# Patient Record
Sex: Male | Born: 1985
Health system: Southern US, Community
[De-identification: ages and names within clinical notes are randomized; demographics above are authoritative.]

## PROBLEM LIST (undated history)

## (undated) DIAGNOSIS — F419 Anxiety disorder, unspecified: Secondary | ICD-10-CM

## (undated) DIAGNOSIS — G44009 Cluster headache syndrome, unspecified, not intractable: Secondary | ICD-10-CM

## (undated) DIAGNOSIS — F329 Major depressive disorder, single episode, unspecified: Secondary | ICD-10-CM

## (undated) DIAGNOSIS — F8081 Childhood onset fluency disorder: Secondary | ICD-10-CM

## (undated) DIAGNOSIS — K219 Gastro-esophageal reflux disease without esophagitis: Secondary | ICD-10-CM

## (undated) DIAGNOSIS — F32A Depression, unspecified: Secondary | ICD-10-CM

## (undated) HISTORY — PX: NO PAST SURGERIES: SHX2092

## (undated) HISTORY — DX: Anxiety disorder, unspecified: F41.9

## (undated) HISTORY — DX: Depression, unspecified: F32.A

## (undated) HISTORY — DX: Childhood onset fluency disorder: F80.81

## (undated) HISTORY — DX: Cluster headache syndrome, unspecified, not intractable: G44.009

## (undated) HISTORY — DX: Major depressive disorder, single episode, unspecified: F32.9

---

## 2008-12-10 ENCOUNTER — Ambulatory Visit: Payer: Self-pay | Admitting: Family Medicine

## 2008-12-15 ENCOUNTER — Ambulatory Visit: Payer: Self-pay | Admitting: Family Medicine

## 2010-06-13 ENCOUNTER — Encounter: Payer: Self-pay | Admitting: Family Medicine

## 2010-06-13 ENCOUNTER — Ambulatory Visit (INDEPENDENT_AMBULATORY_CARE_PROVIDER_SITE_OTHER): Payer: Medicare Other | Admitting: Family Medicine

## 2010-06-13 ENCOUNTER — Other Ambulatory Visit: Payer: Self-pay | Admitting: Family Medicine

## 2010-06-13 ENCOUNTER — Ambulatory Visit: Payer: Medicare Other

## 2010-06-13 ENCOUNTER — Ambulatory Visit
Admission: RE | Admit: 2010-06-13 | Discharge: 2010-06-13 | Disposition: A | Discharge status: Home / Self Care | Payer: Medicare Other | Source: Ambulatory Visit | Attending: Family Medicine | Admitting: Family Medicine

## 2010-06-13 DIAGNOSIS — R52 Pain, unspecified: Secondary | ICD-10-CM

## 2010-06-13 DIAGNOSIS — R05 Cough: Secondary | ICD-10-CM

## 2010-06-13 DIAGNOSIS — M549 Dorsalgia, unspecified: Secondary | ICD-10-CM

## 2010-06-15 ENCOUNTER — Encounter: Payer: Self-pay | Admitting: Family Medicine

## 2010-06-21 NOTE — Assessment & Plan Note (Signed)
Summary: PAIN/DISCOMFORT IN LOWER BACK/SEVERE HEARTBURN/NH rm4   Vital Signs:  Patient Profile:   25 Years Old Male CC:      LBP Height:     68 inches Weight:      216.25 pounds O2 Sat:      98 % O2 treatment:    Room Air Temp:     98.9 degrees F oral Pulse rate:   107 / minute Resp:     18 per minute BP sitting:   138 / 89  (left arm) Cuff size:   regular  Vitals Entered By: Clemens Catholic LPN (June 13, 2010 5:43 PM)                  Updated Prior Medication List: No Medications Current Allergies (reviewed today): No known allergies History of Present Illness Chief Complaint: LBP History of Present Illness:  Subjective:  Patient complains of approximately one week history of of low back pain that only occurs while having a bowel movement.  No anal pain.  No blood in stool.  No recent changes in bowel movements and he denies constipation.  No abdominal pain.  No nausea/vomiting and appetite good.  In fact he notes that he has gained weight over the past year.  He recalls no back injury or change in physical activities recently.  He feels well otherwise.  REVIEW OF SYSTEMS Constitutional Symptoms      Denies fever, chills, night sweats, weight loss, weight gain, and fatigue.  Eyes       Denies change in vision, eye pain, eye discharge, glasses, contact lenses, and eye surgery. Ear/Nose/Throat/Mouth       Denies hearing loss/aids, change in hearing, ear pain, ear discharge, dizziness, frequent runny nose, frequent nose bleeds, sinus problems, sore throat, hoarseness, and tooth pain or bleeding.  Respiratory       Denies dry cough, productive cough, wheezing, shortness of breath, asthma, bronchitis, and emphysema/COPD.  Cardiovascular       Denies murmurs, chest pain, and tires easily with exhertion.    Gastrointestinal       Complains of diarrhea and constipation.      Denies stomach pain, nausea/vomiting, blood in bowel movements, and indigestion. Genitourniary      Denies painful urination, kidney stones, and loss of urinary control. Neurological       Denies paralysis, seizures, and fainting/blackouts. Musculoskeletal       Complains of muscle pain, joint pain, and muscle weakness.      Denies joint stiffness, decreased range of motion, redness, swelling, and gout.  Skin       Denies bruising, unusual mles/lumps or sores, and hair/skin or nail changes.  Psych       Denies mood changes, temper/anger issues, anxiety/stress, speech problems, depression, and sleep problems. Other Comments: pt c/o low back pain, worse with a BM x friday. no constipation. he has had heart burn x worse recently. he has taken OTC Pepto bismol with no relief.   Past History:  Past Medical History: Reviewed history from 12/10/2008 and no changes required. Unremarkable  Past Surgical History: Reviewed history from 12/10/2008 and no changes required. wisdom teeth removed 2009  Family History: mother and father alive and healthy Family History Hypertension  Social History: Reviewed history from 12/10/2008 and no changes required. denies smoking drinks one drink per week denies recreational drug use   Objective:  Appearance:  Patient appears healthy, stated age, and in no acute distress  Eyes:  Pupils  are equal, round, and reactive to light and accomdation.  Extraocular movement is intact.  Conjunctivae are not inflamed.  Mouth/pharynx:  Normal Neck:  Supple.  No adenopathy is present.  No thyromegaly is present  Lungs:  Clear to auscultation.  Breath sounds are equal.  Heart:  Regular rate and rhythm without murmurs, rubs, or gallops.  Abdomen:  Nontender without masses or hepatosplenomegaly.  Bowel sounds are present.  No CVA or flank tenderness.   Back:   Good range of motion.  No tenderness to palpation.  Straight leg raising test is negative.  Sitting knee extension test is negative.  Strength and sensation in the lower extremities is normal.   Patellar and achilles reflexes are normal.  Rectal Exam:  Anus is normal without surrounding erythema.   No external hemorrhoids are present.  No lesions are palpated in the rectal vault.  Stool is heme negative.   Prostate is nontender and not enlarged Skin:  No rash CBC:  WBC 6.4; Normal diff; Hgb 15.9 X-ray:  2-view abdomen: IMPRESSION:   1.  Moderate stool throughout the colon.  Constipation is not excluded. 2.  Question pars defects at L5. Assessment New Problems: BACK PAIN (ICD-724.5)  SUSPECT LOWER BACK PAIN A RESULT OF SPONDYLYLOSIS AT L5, EXACERBATED DURING BOWEL MOVEMENTS.  Plan New Orders: CBC w/Diff [[id number]] T-DG ABD 2 Views [74020] Est. Patient Level III [99213] Planning Comments:   Recommend back strengthening exercises ([facility] information and instruction patient handout given)  Avoid constipation; recommend stool softener and increased fiber in diet (may include Citrucel) Recommend gradual weight loss. If symptoms become worse or constant, recommend follow-up with sports med clinic.   The patient and/or caregiver has been counseled thoroughly with regard to medications prescribed including dosage, schedule, interactions, rationale for use, and possible side effects and they verbalize understanding.  Diagnoses and expected course of recovery discussed and will return if not improved as expected or if the condition worsens. Patient and/or caregiver verbalized understanding.   Orders Added: 1)  CBC w/Diff [[id number]] 2)  T-DG ABD 2 Views [74020] 3)  Est. Patient Level III [46962]

## 2011-01-10 ENCOUNTER — Encounter: Payer: Self-pay | Admitting: Family Medicine

## 2011-01-10 ENCOUNTER — Inpatient Hospital Stay (INDEPENDENT_AMBULATORY_CARE_PROVIDER_SITE_OTHER)
Admission: RE | Admit: 2011-01-10 | Discharge: 2011-01-10 | Disposition: A | Discharge status: Home / Self Care | Payer: Medicare Other | Source: Ambulatory Visit | Attending: Family Medicine | Admitting: Family Medicine

## 2011-01-10 ENCOUNTER — Other Ambulatory Visit: Payer: Medicare Other

## 2011-01-10 ENCOUNTER — Other Ambulatory Visit: Payer: Self-pay | Admitting: Family Medicine

## 2011-01-10 DIAGNOSIS — K219 Gastro-esophageal reflux disease without esophagitis: Secondary | ICD-10-CM

## 2011-01-10 DIAGNOSIS — R079 Chest pain, unspecified: Secondary | ICD-10-CM

## 2011-01-10 DIAGNOSIS — R1011 Right upper quadrant pain: Secondary | ICD-10-CM

## 2011-01-10 DIAGNOSIS — R109 Unspecified abdominal pain: Secondary | ICD-10-CM

## 2011-01-10 LAB — CONVERTED CEMR LAB
Bilirubin Urine: NEGATIVE
Nitrite: NEGATIVE
Urobilinogen, UA: 0.2

## 2011-01-11 ENCOUNTER — Ambulatory Visit
Admission: RE | Admit: 2011-01-11 | Discharge: 2011-01-11 | Disposition: A | Discharge status: Home / Self Care | Payer: Medicare Other | Source: Ambulatory Visit | Attending: Family Medicine | Admitting: Family Medicine

## 2011-01-11 ENCOUNTER — Telehealth (INDEPENDENT_AMBULATORY_CARE_PROVIDER_SITE_OTHER): Payer: Self-pay | Admitting: Emergency Medicine

## 2011-01-11 DIAGNOSIS — R1011 Right upper quadrant pain: Secondary | ICD-10-CM

## 2011-01-15 ENCOUNTER — Encounter: Payer: Self-pay | Admitting: Family Medicine

## 2011-01-15 ENCOUNTER — Ambulatory Visit
Admission: RE | Admit: 2011-01-15 | Discharge: 2011-01-15 | Disposition: A | Discharge status: Home / Self Care | Payer: Medicare Other | Source: Ambulatory Visit | Attending: Family Medicine | Admitting: Family Medicine

## 2011-01-15 ENCOUNTER — Other Ambulatory Visit: Payer: Self-pay | Admitting: Family Medicine

## 2011-01-15 ENCOUNTER — Inpatient Hospital Stay (INDEPENDENT_AMBULATORY_CARE_PROVIDER_SITE_OTHER)
Admission: RE | Admit: 2011-01-15 | Discharge: 2011-01-15 | Disposition: A | Discharge status: Home / Self Care | Payer: Medicare Other | Source: Ambulatory Visit | Attending: Family Medicine | Admitting: Family Medicine

## 2011-01-15 DIAGNOSIS — R071 Chest pain on breathing: Secondary | ICD-10-CM

## 2011-01-15 DIAGNOSIS — R0789 Other chest pain: Secondary | ICD-10-CM

## 2011-01-15 DIAGNOSIS — M546 Pain in thoracic spine: Secondary | ICD-10-CM

## 2011-01-15 DIAGNOSIS — K219 Gastro-esophageal reflux disease without esophagitis: Secondary | ICD-10-CM

## 2011-04-08 NOTE — Telephone Encounter (Signed)
  Phone Note Outgoing Call Call back at [facility] Phone [phone]   Call placed by: Emilio Math,  January 11, 2011 11:21 AM Call placed to: Patient Summary of Call: Called patient informed him of negative labs and ultrasound.  I did tell him about one elevated liver enyzme due to fatty liver.

## 2011-04-08 NOTE — Progress Notes (Signed)
Summary: pain in chest and back rm 4   Vital Signs:  Patient Profile:   25 Years Old Male CC:      Mid epigastric pain x last night Height:     68 inches Weight:      230 pounds O2 Sat:      96 % O2 treatment:    Room Air Temp:     98.4 degrees F oral Pulse rate:   88 / minute Resp:     18 per minute BP sitting:   131 / 85  (left arm) Cuff size:   regular  Vitals Entered By: Clemens Catholic LPN (January 10, 2011 9:00 AM)                  Updated Prior Medication List: No Medications Current Allergies (reviewed today): No known allergies History of Present Illness Chief Complaint: Mid epigastric pain x last night History of Present Illness:  Subjective:  Patient complains of 3 to 4 month history of persistent epigastric and right upper quadrant pain that consistently occurs at bedtime.  He has recurring acid reflux during the day.  He also notes that his indigestion recently has improved for about an hour after eating then recurs.  Last night he had discomfort radiating to his back and right scapular area.  Occasional mild nausea but no vomiting.  Symptoms are not exacerbated by any specific food.  No recent change in bowel movements.  No GU symptoms.  No chest pain with activity.  He has been taking some of his father's OTC omeprazole with some improvement. Family history of Acid reflux father.  Mother has had cholecystectomy.  No family history of ASCVD.  Patient does not smoke  REVIEW OF SYSTEMS Constitutional Symptoms      Denies fever, chills, night sweats, weight loss, weight gain, and fatigue.  Eyes       Denies change in vision, eye pain, eye discharge, glasses, contact lenses, and eye surgery. Ear/Nose/Throat/Mouth       Denies hearing loss/aids, change in hearing, ear pain, ear discharge, dizziness, frequent runny nose, frequent nose bleeds, sinus problems, sore throat, hoarseness, and tooth pain or bleeding.  Respiratory       Denies dry cough, productive  cough, wheezing, shortness of breath, asthma, bronchitis, and emphysema/COPD.  Cardiovascular       Denies murmurs, chest pain, and tires easily with exhertion.    Gastrointestinal       Complains of stomach pain.      Denies nausea/vomiting, diarrhea, constipation, blood in bowel movements, and indigestion.      Comments: nausea Genitourniary       Denies painful urination, kidney stones, and loss of urinary control. Neurological       Complains of headaches.      Denies paralysis, seizures, and fainting/blackouts. Musculoskeletal       Complains of muscle pain and joint pain.      Denies joint stiffness, decreased range of motion, redness, swelling, muscle weakness, and gout.  Skin       Denies bruising, unusual mles/lumps or sores, and hair/skin or nail changes.  Psych       Denies mood changes, temper/anger issues, anxiety/stress, speech problems, depression, and sleep problems. Other Comments: pt c/o sharp mid epigastric pain x last night after lying down.  he took Gas X with no relief..   Past History:  Past Medical History: Reviewed history from 12/10/2008 and no changes required. Unremarkable  Past Surgical History:  Reviewed history from 12/10/2008 and no changes required. wisdom teeth removed 2009  Family History: Reviewed history from 06/13/2010 and no changes required. mother and father alive and healthy Family History Hypertension  Social History: Reviewed history from 12/10/2008 and no changes required. denies smoking drinks one drink per week denies recreational drug use   Objective:  Appearance:  Patient is obese but otherwise appears healthy, stated age, and in no acute distress.  He has a severe stutter Eyes:  Pupils are equal, round, and reactive to light and accomodation.  Extraocular movement is intact.  Conjunctivae are not inflamed.  Mouth/pharynx:  moist mucous membranes  Neck:  Supple.  No adenopathy is present.  No thyromegaly is present  Lungs:   Clear to auscultation.  Breath sounds are equal.  Heart:  Regular rate and rhythm without murmurs, rubs, or gallops.  Abdomen:  Mild tenderness in the right upper quadrant and sub-ziphoid epigastric area without masses or hepatosplenomegaly.  There is also mild tenderness in the peri-umbilical region and right lower quadrant. Bowel sounds are present.  No CVA or flank tenderness.   Extremities:  No edema.   Skin:  No rash urinalysis (dipstick):  negative CBC:  WBC 6.3 ; LY 30.4, MO 7.2, GR 62.4; Hgb 16.0 EKG:  Normal  Assessment New Problems: ABDOMINAL PAIN (ICD-789.00) CHEST PAIN (ICD-786.50) ESOPHAGEAL REFLUX (ICD-530.81)  ? BILIARY COLIC  Plan New Medications/Changes: OMEPRAZOLE 40 MG CPDR (OMEPRAZOLE) One by mouth Qam 30 min AC  #15 x 1, 01/10/2011, Donna Christen MD  New Orders: CBC w/Diff [[id number]] Urinalysis [CPT-81003] T-Comprehensive Metabolic Panel [[id number]] T-Amylase [82150-23210] T-Lipase [83690-23215] EKG w/ Interpretation [93000] Ultrasound Abdomen [UAS] Est. Patient Level V [19147] Planning Comments:   CMP, Amylase, Lipase pending. Schedule GB ultrasound. Begin omeprazole 40mg , one qam ac. Recommend establishing relationship with PCP   The patient and/or caregiver has been counseled thoroughly with regard to medications prescribed including dosage, schedule, interactions, rationale for use, and possible side effects and they verbalize understanding.  Diagnoses and expected course of recovery discussed and will return if not improved as expected or if the condition worsens. Patient and/or caregiver verbalized understanding.  Prescriptions: OMEPRAZOLE 40 MG CPDR (OMEPRAZOLE) One by mouth Qam 30 min AC  #15 x 1   Entered and Authorized by:   Donna Christen MD   Signed by:   Donna Christen MD on 01/10/2011   Method used:   Print then Give to Patient   RxID:   [id number]   Orders Added: 1)  CBC w/Diff [[id number]] 2)  Urinalysis [CPT-81003] 3)   T-Comprehensive Metabolic Panel [[id number]] 4)  T-Amylase [82150-23210] 5)  T-Lipase [83690-23215] 6)  EKG w/ Interpretation [93000] 7)  Ultrasound Abdomen [UAS] 8)  Est. Patient Level V [24401]    Laboratory Results   Urine Tests  Date/Time Received: January 10, 2011 10:06 AM  Date/Time Reported: January 10, 2011 10:06 AM   Routine Urinalysis   Color: yellow Appearance: Clear Glucose: negative   (Normal Range: Negative) Bilirubin: negative   (Normal Range: Negative) Ketone: negative   (Normal Range: Negative) Spec. Gravity: 1.025   (Normal Range: 1.003-1.035) Blood: negative   (Normal Range: Negative) pH: 5.5   (Normal Range: 5.0-8.0) Protein: negative   (Normal Range: Negative) Urobilinogen: 0.2   (Normal Range: 0-1) Nitrite: negative   (Normal Range: Negative) Leukocyte Esterace: negative   (Normal Range: Negative)

## 2011-04-08 NOTE — Progress Notes (Signed)
Summary: PAIN IN CHEST/UPPER BACK rm 2   Vital Signs:  Patient Profile:   25 Years Old Male CC:      pain in upper back, chest x 2 days Height:     68 inches Weight:      230.75 pounds O2 Sat:      98 % O2 treatment:    Room Air Temp:     98.4 degrees F oral Pulse rate:   85 / minute Resp:     16 per minute BP sitting:   117 / 76  (left arm) Cuff size:   regular  Vitals Entered By: Clemens Catholic LPN (January 15, 2011 4:09 PM)                  Updated Prior Medication List: OMEPRAZOLE 40 MG CPDR (OMEPRAZOLE) One by mouth Qam 30 min AC  Current Allergies (reviewed today): No known allergies History of Present Illness Chief Complaint: pain in upper back, chest x 2 days History of Present Illness:  Subjective:  Patient reports that his GI symptoms have resolved after starting omeprazole.  Over the past 2 to 3 days he has developed a sensation of tightness in his anterior chest that somewhat restricts his respiration.  He has also developed discomfort over and beneath his shoulder blades, to the extent that he has difficulty sleeping when supine at night.  No shortness of breath.  No cough.  No fevers, chills, and sweats.  He wonders if the symptoms could be an adverse effect from omeprazole   REVIEW OF SYSTEMS Constitutional Symptoms      Denies fever, chills, night sweats, weight loss, weight gain, and fatigue.  Eyes       Denies change in vision, eye pain, eye discharge, glasses, contact lenses, and eye surgery. Ear/Nose/Throat/Mouth       Denies hearing loss/aids, change in hearing, ear pain, ear discharge, dizziness, frequent runny nose, frequent nose bleeds, sinus problems, sore throat, hoarseness, and tooth pain or bleeding.  Respiratory       Complains of shortness of breath.      Denies dry cough, productive cough, wheezing, asthma, bronchitis, and emphysema/COPD.  Cardiovascular       Complains of chest pain.      Denies murmurs and tires easily with exhertion.     Gastrointestinal       Denies stomach pain, nausea/vomiting, diarrhea, constipation, blood in bowel movements, and indigestion. Genitourniary       Denies painful urination, kidney stones, and loss of urinary control. Neurological       Denies paralysis, seizures, and fainting/blackouts. Musculoskeletal       Complains of muscle pain and joint pain.      Denies joint stiffness, decreased range of motion, redness, swelling, muscle weakness, and gout.  Skin       Denies bruising, unusual mles/lumps or sores, and hair/skin or nail changes.  Psych       Denies mood changes, temper/anger issues, anxiety/stress, speech problems, depression, and sleep problems. Other Comments: pt c/o upper back and chest pain, SOB, not sleeping x 2 days. he states that his reflux s/s are better.    Past History:  Past Medical History:  GERD  Past Surgical History: Reviewed history from 12/10/2008 and no changes required. wisdom teeth removed 2009  Family History: Reviewed history from 06/13/2010 and no changes required. mother and father alive and healthy Family History Hypertension  Social History: Reviewed history from 12/10/2008 and no changes required. denies  smoking drinks one drink per week denies recreational drug use   Objective:  No acute distress; patient appears comfortable and alert Eyes:  Pupils are equal, round, and reactive to light and accomodation.  Extraocular movement is intact.  Conjunctivae are not inflamed.  Pharynx:  Normal  Neck:  Supple.  No adenopathy is present.  Lungs:  Clear to auscultation.  Breath sounds are equal.  Chest:  No tenderness to palpation anteriorly.  Posterior chest reveals no tenderness over scapulae or back Heart:  Regular rate and rhythm without murmurs, rubs, or gallops.  Abdomen:  Nontender without masses or hepatosplenomegaly.  Bowel sounds are present.  No CVA or flank tenderness.  Skin:  No rash Chest x-ray:  Negative Assessment New  Problems: BACK PAIN, THORACIC REGION (ICD-724.1) CHEST [place] PAIN, ANTERIOR (ICD-786.52) GERD (ICD-530.81)  MUSCULOSKELETAL CHEST PAIN.  ? ADVERSE REACTION FROM OMEPRAZOLE  Plan New Orders: T-Chest x-ray, 2 views [71020] Est. Patient Level III [16109] Planning Comments:   Recommend holding omeprazole for 2 to 3 days.  If chest discomfort improves, try resuming omeprazole at 20mg  daily.  Tylenol for pain.   The patient and/or caregiver has been counseled thoroughly with regard to medications prescribed including dosage, schedule, interactions, rationale for use, and possible side effects and they verbalize understanding.  Diagnoses and expected course of recovery discussed and will return if not improved as expected or if the condition worsens. Patient and/or caregiver verbalized understanding.   Orders Added: 1)  T-Chest x-ray, 2 views [71020] 2)  Est. Patient Level III [60454]

## 2011-05-10 ENCOUNTER — Emergency Department (INDEPENDENT_AMBULATORY_CARE_PROVIDER_SITE_OTHER)
Admission: EM | Admit: 2011-05-10 | Discharge: 2011-05-10 | Disposition: A | Discharge status: Home / Self Care | Payer: Medicare Other | Source: Home / Self Care | Attending: Emergency Medicine | Admitting: Emergency Medicine

## 2011-05-10 ENCOUNTER — Encounter: Payer: Self-pay | Admitting: *Deleted

## 2011-05-10 DIAGNOSIS — R110 Nausea: Secondary | ICD-10-CM | POA: Diagnosis not present

## 2011-05-10 DIAGNOSIS — R109 Unspecified abdominal pain: Secondary | ICD-10-CM | POA: Diagnosis not present

## 2011-05-10 HISTORY — DX: Gastro-esophageal reflux disease without esophagitis: K21.9

## 2011-05-10 MED ORDER — PROMETHAZINE HCL 25 MG PO TABS: 25.0000 mg | ORAL_TABLET | Freq: Three times a day (TID) | ORAL | Status: DC | PRN

## 2011-05-10 NOTE — ED Notes (Signed)
Pt c/o off and on nausea, diarrhea, constipation and generalized abd pain x 2 1/2 wks. He has tried pepto bismol.

## 2011-05-10 NOTE — ED Provider Notes (Signed)
History     CSN: [id number]  Arrival date & time 05/10/11  1228   First MD Initiated Contact with Patient 05/10/11 1250      Chief Complaint  Patient presents with  . Nausea  . Diarrhea    (Consider location/radiation/quality/duration/timing/severity/associated sxs/prior treatment) HPI This patient presents today with nausea, and intermittent diarrhea and constipation for the last 10 days. He does not recall eating anything funny and no other sick contacts. No upper respiratory symptoms. He states that he has some spasms while having bowel movements and has about 5 soft stools per day. He has not been using any medications. His worse symptom for him it is his nausea. He has had an ultrasound and lab work done in the last few months that he reports is normal. He has never seen a specialist for this. He has been having nausea and intermittent symptoms for about the last 6 months. No fever, chills, or urinary symptoms.  Past Medical History  Diagnosis Date  . GERD (gastroesophageal reflux disease)     History reviewed. No pertinent past surgical history.  History reviewed. No pertinent family history.  History  Substance Use Topics  . Smoking status: Current Everyday Smoker -- 1.0 packs/day  . Smokeless tobacco: Not on file  . Alcohol Use: Yes      Review of Systems  Allergies  Review of patient's allergies indicates no known allergies.  Home Medications   Current Outpatient Rx  Name Route Sig Dispense Refill  . OMEPRAZOLE 20 MG PO CPDR Oral Take 20 mg by mouth daily.      Marland Kitchen PROMETHAZINE HCL 25 MG PO TABS Oral Take 1 tablet (25 mg total) by mouth every 8 (eight) hours as needed for nausea. Take 1/2 - 1 by mouth every 8 hours as needed for nausea 30 tablet 0    BP 107/69  Pulse 82  Temp(Src) 98.4 F (36.9 C) (Oral)  Resp 16  Ht 5\' 7"  (1.702 m)  Wt 233 lb 8 oz (105.915 kg)  BMI 36.57 kg/m2  SpO2 96%  Physical Exam  Nursing note and vitals  reviewed. Constitutional: He is oriented to person, place, and time. He appears well-developed and well-nourished.  HENT:  Head: Normocephalic and atraumatic.  Eyes: No scleral icterus.  Neck: Neck supple.  Cardiovascular: Regular rhythm and normal heart sounds.   Pulmonary/Chest: Effort normal and breath sounds normal. No respiratory distress.  Abdominal: Soft. Bowel sounds are normal. He exhibits no abdominal bruit and no mass. There is no hepatosplenomegaly. There is no tenderness. There is no rigidity, no rebound, no guarding, no CVA tenderness, no tenderness at McBurney's point and negative Murphy's sign.  Neurological: He is alert and oriented to person, place, and time.  Skin: Skin is warm and dry.  Psychiatric: He has a normal mood and affect. His speech is normal.    ED Course  Procedures (including critical care time)  Labs Reviewed - No data to display No results found.   1. Nausea   2. Abdominal pain, unspecified site       MDM   He has been having long-standing nausea and abdominal discomfort for the last few months. Abdominal ultrasound was also done and he reports it was negative. Blood work is also been done. Since this is a long-standing issue, really like him to see a gastroenterologist to see if there's anything more that can be done. May have some kind of gastroparesis versus irritable bowel syndrome that could be treated medically.  The patient agrees and thinks this would be a good idea as well. I've given a prescription for Phenergan to use as needed and have advised him on a bland diet for the next one to 2 weeks. It may also be appropriate for him to eventually talked to a nutritionist to decrease processed foods.  Lily Kocher, MD 05/10/11 1256

## 2011-05-14 DIAGNOSIS — E669 Obesity, unspecified: Secondary | ICD-10-CM | POA: Diagnosis not present

## 2011-05-14 DIAGNOSIS — R1110 Vomiting, unspecified: Secondary | ICD-10-CM | POA: Diagnosis not present

## 2011-05-14 DIAGNOSIS — R197 Diarrhea, unspecified: Secondary | ICD-10-CM | POA: Diagnosis not present

## 2011-05-14 DIAGNOSIS — R109 Unspecified abdominal pain: Secondary | ICD-10-CM | POA: Diagnosis not present

## 2011-05-14 DIAGNOSIS — R112 Nausea with vomiting, unspecified: Secondary | ICD-10-CM | POA: Diagnosis not present

## 2011-05-14 DIAGNOSIS — R1084 Generalized abdominal pain: Secondary | ICD-10-CM | POA: Diagnosis not present

## 2011-05-14 DIAGNOSIS — R110 Nausea: Secondary | ICD-10-CM | POA: Diagnosis not present

## 2011-05-15 DIAGNOSIS — R109 Unspecified abdominal pain: Secondary | ICD-10-CM | POA: Diagnosis not present

## 2011-05-15 DIAGNOSIS — R1084 Generalized abdominal pain: Secondary | ICD-10-CM | POA: Diagnosis not present

## 2011-05-15 DIAGNOSIS — R197 Diarrhea, unspecified: Secondary | ICD-10-CM | POA: Diagnosis not present

## 2011-05-15 DIAGNOSIS — R112 Nausea with vomiting, unspecified: Secondary | ICD-10-CM | POA: Diagnosis not present

## 2011-05-16 DIAGNOSIS — R1013 Epigastric pain: Secondary | ICD-10-CM | POA: Diagnosis not present

## 2011-05-16 DIAGNOSIS — R110 Nausea: Secondary | ICD-10-CM | POA: Diagnosis not present

## 2011-05-16 DIAGNOSIS — K219 Gastro-esophageal reflux disease without esophagitis: Secondary | ICD-10-CM | POA: Diagnosis not present

## 2011-05-17 DIAGNOSIS — R1013 Epigastric pain: Secondary | ICD-10-CM | POA: Diagnosis not present

## 2011-05-17 DIAGNOSIS — R110 Nausea: Secondary | ICD-10-CM | POA: Diagnosis not present

## 2011-05-17 DIAGNOSIS — K2980 Duodenitis without bleeding: Secondary | ICD-10-CM | POA: Diagnosis not present

## 2011-05-24 DIAGNOSIS — R112 Nausea with vomiting, unspecified: Secondary | ICD-10-CM | POA: Diagnosis not present

## 2011-05-24 DIAGNOSIS — Q458 Other specified congenital malformations of digestive system: Secondary | ICD-10-CM | POA: Diagnosis not present

## 2011-05-24 DIAGNOSIS — R933 Abnormal findings on diagnostic imaging of other parts of digestive tract: Secondary | ICD-10-CM | POA: Diagnosis not present

## 2011-05-24 DIAGNOSIS — R1013 Epigastric pain: Secondary | ICD-10-CM | POA: Diagnosis not present

## 2011-05-24 DIAGNOSIS — K2940 Chronic atrophic gastritis without bleeding: Secondary | ICD-10-CM | POA: Diagnosis not present

## 2011-07-16 DIAGNOSIS — J3489 Other specified disorders of nose and nasal sinuses: Secondary | ICD-10-CM | POA: Diagnosis not present

## 2011-07-16 DIAGNOSIS — K2900 Acute gastritis without bleeding: Secondary | ICD-10-CM | POA: Diagnosis not present

## 2011-07-16 DIAGNOSIS — R110 Nausea: Secondary | ICD-10-CM | POA: Diagnosis not present

## 2011-07-16 DIAGNOSIS — J338 Other polyp of sinus: Secondary | ICD-10-CM | POA: Diagnosis not present

## 2011-07-16 DIAGNOSIS — R51 Headache: Secondary | ICD-10-CM | POA: Diagnosis not present

## 2011-08-03 DIAGNOSIS — K2970 Gastritis, unspecified, without bleeding: Secondary | ICD-10-CM | POA: Diagnosis not present

## 2011-08-03 DIAGNOSIS — K2990 Gastroduodenitis, unspecified, without bleeding: Secondary | ICD-10-CM | POA: Diagnosis not present

## 2011-08-03 DIAGNOSIS — K7689 Other specified diseases of liver: Secondary | ICD-10-CM | POA: Diagnosis not present

## 2011-08-03 DIAGNOSIS — Z8711 Personal history of peptic ulcer disease: Secondary | ICD-10-CM | POA: Diagnosis not present

## 2011-08-03 DIAGNOSIS — Z8719 Personal history of other diseases of the digestive system: Secondary | ICD-10-CM | POA: Diagnosis not present

## 2011-08-03 DIAGNOSIS — R109 Unspecified abdominal pain: Secondary | ICD-10-CM | POA: Diagnosis not present

## 2011-08-09 DIAGNOSIS — R112 Nausea with vomiting, unspecified: Secondary | ICD-10-CM | POA: Diagnosis not present

## 2011-08-28 DIAGNOSIS — R109 Unspecified abdominal pain: Secondary | ICD-10-CM | POA: Diagnosis not present

## 2011-08-28 DIAGNOSIS — K5900 Constipation, unspecified: Secondary | ICD-10-CM | POA: Diagnosis not present

## 2011-08-28 DIAGNOSIS — R52 Pain, unspecified: Secondary | ICD-10-CM | POA: Diagnosis not present

## 2011-08-28 DIAGNOSIS — R1084 Generalized abdominal pain: Secondary | ICD-10-CM | POA: Diagnosis not present

## 2011-08-28 DIAGNOSIS — A048 Other specified bacterial intestinal infections: Secondary | ICD-10-CM | POA: Diagnosis not present

## 2011-09-11 DIAGNOSIS — A048 Other specified bacterial intestinal infections: Secondary | ICD-10-CM | POA: Diagnosis not present

## 2011-09-11 DIAGNOSIS — R110 Nausea: Secondary | ICD-10-CM | POA: Diagnosis not present

## 2011-11-11 DIAGNOSIS — R112 Nausea with vomiting, unspecified: Secondary | ICD-10-CM | POA: Diagnosis not present

## 2011-11-11 DIAGNOSIS — R634 Abnormal weight loss: Secondary | ICD-10-CM | POA: Diagnosis not present

## 2011-11-11 DIAGNOSIS — R109 Unspecified abdominal pain: Secondary | ICD-10-CM | POA: Diagnosis not present

## 2011-12-05 DIAGNOSIS — R634 Abnormal weight loss: Secondary | ICD-10-CM | POA: Diagnosis not present

## 2011-12-05 DIAGNOSIS — K7689 Other specified diseases of liver: Secondary | ICD-10-CM | POA: Diagnosis not present

## 2011-12-05 DIAGNOSIS — A048 Other specified bacterial intestinal infections: Secondary | ICD-10-CM | POA: Diagnosis not present

## 2011-12-05 DIAGNOSIS — R112 Nausea with vomiting, unspecified: Secondary | ICD-10-CM | POA: Diagnosis not present

## 2012-01-27 DIAGNOSIS — R634 Abnormal weight loss: Secondary | ICD-10-CM | POA: Diagnosis not present

## 2012-01-27 DIAGNOSIS — R112 Nausea with vomiting, unspecified: Secondary | ICD-10-CM | POA: Diagnosis not present

## 2012-02-26 DIAGNOSIS — R112 Nausea with vomiting, unspecified: Secondary | ICD-10-CM | POA: Diagnosis not present

## 2012-07-02 DIAGNOSIS — J069 Acute upper respiratory infection, unspecified: Secondary | ICD-10-CM | POA: Diagnosis not present

## 2012-11-10 ENCOUNTER — Telehealth: Payer: Self-pay | Admitting: *Deleted

## 2012-11-10 ENCOUNTER — Ambulatory Visit (INDEPENDENT_AMBULATORY_CARE_PROVIDER_SITE_OTHER): Payer: Medicare Other | Admitting: Family Medicine

## 2012-11-10 ENCOUNTER — Encounter: Payer: Self-pay | Admitting: Family Medicine

## 2012-11-10 VITALS — BP 124/72 | HR 75 | Ht 67.75 in | Wt 158.0 lb

## 2012-11-10 DIAGNOSIS — F329 Major depressive disorder, single episode, unspecified: Secondary | ICD-10-CM | POA: Insufficient documentation

## 2012-11-10 DIAGNOSIS — R259 Unspecified abnormal involuntary movements: Secondary | ICD-10-CM | POA: Diagnosis not present

## 2012-11-10 DIAGNOSIS — K219 Gastro-esophageal reflux disease without esophagitis: Secondary | ICD-10-CM

## 2012-11-10 DIAGNOSIS — F8081 Childhood onset fluency disorder: Secondary | ICD-10-CM | POA: Insufficient documentation

## 2012-11-10 DIAGNOSIS — F341 Dysthymic disorder: Secondary | ICD-10-CM

## 2012-11-10 DIAGNOSIS — F419 Anxiety disorder, unspecified: Secondary | ICD-10-CM | POA: Insufficient documentation

## 2012-11-10 DIAGNOSIS — R251 Tremor, unspecified: Secondary | ICD-10-CM

## 2012-11-10 DIAGNOSIS — F411 Generalized anxiety disorder: Secondary | ICD-10-CM

## 2012-11-10 DIAGNOSIS — F32A Depression, unspecified: Secondary | ICD-10-CM | POA: Insufficient documentation

## 2012-11-10 LAB — TSH: TSH: 0.929 u[IU]/mL (ref 0.350–4.500)

## 2012-11-10 MED ORDER — VENLAFAXINE HCL ER 75 MG PO CP24: ORAL_CAPSULE | ORAL | Status: DC

## 2012-11-10 MED ORDER — BUSPIRONE HCL 10 MG PO TABS: ORAL_TABLET | ORAL | Status: DC

## 2012-11-10 NOTE — Telephone Encounter (Signed)
Pt calls and states that the antidepressant you sent over is too expensive and wants to know if there is something cheaper an/or generic that you can send instead. Barry Dienes, LPN

## 2012-11-10 NOTE — Progress Notes (Signed)
CC: Austin Gill is a 27 y.o. male is here for Establish Care and Anxiety   Subjective: HPI:  Pleasant 27 year old on disability for stuttering here to establish care.  Patient complains of 2 months of worsening anxiety that came on somewhat abruptly 2 months ago has not gotten better or worse since onset. Describes as moderate severity. Slightly improved for a few hours after exercising on a daily basis. Nothing particularly makes symptoms worse but they're more noticeable at school and when trying to concentrate. Describes a constant sense of worrying about nothing in particular, physical symptoms of tremor occurred when anxiety is at its worst. Anxiety seems to interfere with sleep. He denies any recent or remote tobacco, alcohol, nor recreational drug use. He feels there is a component of depression that has joined anxiety over the past month moderate to mild in severity nothing particularly makes this better or worse.. he has been treated for this with Wellbutrin years ago which was mildly helpful, Paxil decades ago which caused suicidal thoughts. There has been no recent thoughts of wanting to harm himself or others.  Denies mental disturbance other than above. He lives with his parents, describes a great relationship, no other social stressors.  Describes a history of H. pylori treated with triple therapy a year ago he continues on a PPI without any abdominal pain reflux symptoms for gastrointestinal complaints  Review of Systems - General ROS: negative for - chills, fever, night sweats, weight gain or weight loss Ophthalmic ROS: negative for - decreased vision ENT ROS: negative for - hearing change, nasal congestion, tinnitus or allergies Hematological and Lymphatic ROS: negative for - bleeding problems, bruising or swollen lymph nodes Breast ROS: negative Respiratory ROS: no cough, shortness of breath, or wheezing Cardiovascular ROS: no chest pain or dyspnea on exertion Gastrointestinal  ROS: no abdominal pain, change in bowel habits, or black or bloody stools Genito-Urinary ROS: negative for - genital discharge, genital ulcers, incontinence or abnormal bleeding from genitals Musculoskeletal ROS: negative for - joint pain or muscle pain Neurological ROS: negative for - headaches or memory loss Dermatological ROS: negative for lumps, mole changes, rash and skin lesion changes  Past Medical History  Diagnosis Date  . GERD (gastroesophageal reflux disease)      History reviewed. No pertinent family history.   History  Substance Use Topics  . Smoking status: Never Smoker   . Smokeless tobacco: Not on file  . Alcohol Use: No     Objective: Filed Vitals:   11/10/12 1019  BP: 124/72  Pulse: 75    General: Alert and Oriented, No Acute Distress HEENT: Pupils equal, round, reactive to light. Conjunctivae clear.  Moist mucous membranes, pharynx unremarkable Lungs: Clear to auscultation bilaterally, no wheezing/ronchi/rales.  Comfortable work of breathing. Good air movement. Cardiac: Regular rate and rhythm. Normal S1/S2.  No murmurs, rubs, nor gallops.   Abdomen: Soft nontender Extremities: No peripheral edema.  Strong peripheral pulses.  Mental Status: No depression,nor agitation.  Mild anxiety, Frequent stuttering Skin: Warm and dry.  PHQ9=16 GAD-7= 20  Assessment & Plan: Kc was seen today for establish care and anxiety.  Diagnoses and associated orders for this visit:  Anxiety and depression - venlafaxine XR (EFFEXOR XR) 75 MG 24 hr capsule; Start with one capsule every morning for five days then increase to two capsules every morning. - Ambulatory referral to Psychiatry  GERD (gastroesophageal reflux disease)  Tremor - TSH  Stutter    Anxiety and depression: Uncontrolled, he did not  feel he had a great response with Wellbutrin years ago we will start Effexor avoiding SSRI given suicidal thoughts decades ago. Contracted for safety. He is  interested in professional counseling. Referral placed GERD: Control, continue PPI Tremor with anxiety we'll rule out hyperthyroidism  Return in about 4 weeks (around 12/08/2012) for depression and anxiety.

## 2012-11-10 NOTE — Telephone Encounter (Signed)
Substitution of buspirone sent to his [facility], I believe this is on the $4 list and should also significantly improve his anxiety.

## 2012-11-11 NOTE — Telephone Encounter (Signed)
Pt.notified

## 2012-12-07 ENCOUNTER — Encounter: Payer: Self-pay | Admitting: Family Medicine

## 2012-12-07 DIAGNOSIS — K760 Fatty (change of) liver, not elsewhere classified: Secondary | ICD-10-CM | POA: Insufficient documentation

## 2012-12-07 DIAGNOSIS — J338 Other polyp of sinus: Secondary | ICD-10-CM | POA: Insufficient documentation

## 2012-12-08 ENCOUNTER — Ambulatory Visit (INDEPENDENT_AMBULATORY_CARE_PROVIDER_SITE_OTHER): Payer: Medicare Other | Admitting: Family Medicine

## 2012-12-08 ENCOUNTER — Encounter: Payer: Self-pay | Admitting: Family Medicine

## 2012-12-08 VITALS — BP 118/70 | HR 78 | Wt 166.0 lb

## 2012-12-08 DIAGNOSIS — F341 Dysthymic disorder: Secondary | ICD-10-CM | POA: Diagnosis not present

## 2012-12-08 DIAGNOSIS — F419 Anxiety disorder, unspecified: Secondary | ICD-10-CM

## 2012-12-08 DIAGNOSIS — R110 Nausea: Secondary | ICD-10-CM

## 2012-12-08 MED ORDER — BUPROPION HCL ER (XL) 150 MG PO TB24: 150.0000 mg | ORAL_TABLET | ORAL | Status: DC

## 2012-12-08 NOTE — Progress Notes (Addendum)
CC: Austin Gill is a 27 y.o. male is here for Anxiety and Depression   Subjective: HPI:  Followup anxiety and depression: unable to afford Effexor he was taking BuSpar however developed nausea soon after starting it persisted daily and was intolerable it resolved completely the day after stopping BuSpar . He has been without medication for the last 2 weeks, he describes his depression as moderate in severity anxiety is now mild, symptoms have slightly improved since starting break from school but he is getting anxious about restarting in 2 weeks. He complains that depression is causing fatigue irregular sleep cycle and overall sense of gloom. Nothing else makes symptoms better or worse other than above symptoms are present on daily basis has not improved significantly since decline months ago . Denies that when harm himself or others, fevers, chills, unintentional weight loss or gain. Denies current nausea, abdominal pain, and GI disturbance   Review Of Systems Outlined In HPI  Past Medical History  Diagnosis Date  . GERD (gastroesophageal reflux disease)      History reviewed. No pertinent family history.   History  Substance Use Topics  . Smoking status: Never Smoker   . Smokeless tobacco: Not on file  . Alcohol Use: No     Objective: Filed Vitals:   12/08/12 1006  BP: 118/70  Pulse: 78    Vital signs reviewed. General: Alert and Oriented, No Acute Distress HEENT: Pupils equal, round, reactive to light. Conjunctivae clear.  External ears unremarkable.  Moist mucous membranes. Lungs: Clear and comfortable work of breathing, speaking in full sentences without accessory muscle use. Cardiac: Regular rate and rhythm.  Neuro: CN II-XII grossly intact, gait normal. Extremities: No peripheral edema.  Strong peripheral pulses.  Mental Status: No depression, anxiety, nor agitation. Logical though process. Skin: Warm and dry.  Assessment & Plan: Bassam was seen today for anxiety  and depression.  Diagnoses and associated orders for this visit:  Anxiety and depression - Discontinue: buPROPion (WELLBUTRIN XL) 150 MG 24 hr tablet; Take 1 tablet (150 mg total) by mouth every morning. - buPROPion (WELLBUTRIN XL) 150 MG 24 hr tablet; Take 1 tablet (150 mg total) by mouth every morning.  Nausea alone    Anxiety and depression: Chronic uncontrolled condition we discussed options including SNRI/SSRI, he would like to go back on Wellbutrin that he used 5 years ago, contracted for safety  Nausea: Resolved, feel this is due to BuSpar will have to intolerance list  Return in about 4 weeks (around 01/05/2013).

## 2013-01-05 ENCOUNTER — Ambulatory Visit: Payer: Medicare Other | Admitting: Family Medicine

## 2013-01-06 ENCOUNTER — Ambulatory Visit (INDEPENDENT_AMBULATORY_CARE_PROVIDER_SITE_OTHER): Payer: Medicare Other | Admitting: Family Medicine

## 2013-01-06 ENCOUNTER — Telehealth: Payer: Self-pay | Admitting: Family Medicine

## 2013-01-06 ENCOUNTER — Encounter: Payer: Self-pay | Admitting: Family Medicine

## 2013-01-06 VITALS — BP 111/61 | HR 72 | Wt 170.0 lb

## 2013-01-06 DIAGNOSIS — F341 Dysthymic disorder: Secondary | ICD-10-CM | POA: Diagnosis not present

## 2013-01-06 DIAGNOSIS — F329 Major depressive disorder, single episode, unspecified: Secondary | ICD-10-CM

## 2013-01-06 DIAGNOSIS — R05 Cough: Secondary | ICD-10-CM | POA: Diagnosis not present

## 2013-01-06 MED ORDER — BUPROPION HCL ER (XL) 150 MG PO TB24: 150.0000 mg | ORAL_TABLET | ORAL | Status: DC

## 2013-01-06 MED ORDER — CETIRIZINE HCL 10 MG PO TABS: 10.0000 mg | ORAL_TABLET | Freq: Every day | ORAL | Status: DC

## 2013-01-06 NOTE — Telephone Encounter (Signed)
Left message on vm

## 2013-01-06 NOTE — Progress Notes (Signed)
CC: Austin Gill is a 27 y.o. male is here for anxiety and depression   Subjective: HPI:  Followup anxiety: Patient has been taking Wellbutrin 150 mg daily. Since I saw him last he's noticed improvement with decreased anxiety while at school and while around crowds. He states he still has mild anxiety most days of the week but it does not interfere with his quality of life. Denies thoughts wanting to harm himself or others, depression, mental disturbance, paranoia, nor sleep disturbance  Patient complains of a nonproductive cough that has been present for 4 weeks. No interventions as of yet is presently daily basis can occur anytime of the day described as mild in severity nothing particularly makes it better or worse. Denies blood in sputum. Denies fevers, chills, chest pain, shortness of breath, wheezing, facial pressure, nasal congestion.   Review Of Systems Outlined In HPI  Past Medical History  Diagnosis Date  . GERD (gastroesophageal reflux disease)      History reviewed. No pertinent family history.   History  Substance Use Topics  . Smoking status: Never Smoker   . Smokeless tobacco: Not on file  . Alcohol Use: No     Objective: Filed Vitals:   01/06/13 0954  BP: 111/61  Pulse: 72    General: Alert and Oriented, No Acute Distress HEENT: Pupils equal, round, reactive to light. Conjunctivae clear.  External ears unremarkable, canals clear with intact TMs with appropriate landmarks.  Middle ear appears open without effusion. Pink inferior turbinates.  Moist mucous membranes, pharynx without inflammation nor lesions however moderate cobblestoning.  Neck supple without palpable lymphadenopathy nor abnormal masses. Lungs: Clear to auscultation bilaterally, no wheezing/ronchi/rales.  Comfortable work of breathing. Good air movement. Cardiac: Regular rate and rhythm. Normal S1/S2.  No murmurs, rubs, nor gallops.   Extremities: No peripheral edema.  Strong peripheral pulses.   Mental Status: No depression, anxiety, nor agitation. Skin: Warm and dry.  Assessment & Plan: Austin Gill was seen today for anxiety and depression.  Diagnoses and associated orders for this visit:  Anxiety and depression - Discontinue: buPROPion (WELLBUTRIN XL) 150 MG 24 hr tablet; Take 1 tablet (150 mg total) by mouth every morning. - buPROPion (WELLBUTRIN XL) 150 MG 24 hr tablet; Take 1 tablet (150 mg total) by mouth every morning.  Cough  Other Orders - cetirizine (ZYRTEC ALLERGY) 10 MG tablet; Take 1 tablet (10 mg total) by mouth daily.    Cough: Discussed with patient my suspicion of postnasal drip start  generic Zyrtec call if not improving after a few days and will call and nasal steroid if needed Anxiety and depression: Stable and improved continue Wellbutrin  25 minutes spent face-to-face during visit today of which at least 50% was counseling or coordinating care regarding anxiety, depression, cough.   Return in about 3 months (around 04/07/2013) for Anxiety FU.

## 2013-01-06 NOTE — Telephone Encounter (Signed)
Sue Lush, Will you please let Abhimanyu know that [facility] reports wellbutrin is on backorder until October, I've reprinted an Rx for this that I'd encourage him to use to see if he can find it in stock at places such as [facility], [facility], or any local pharmacies to last him until [facility] has this back in stock.  Rx in your inbox.

## 2013-03-11 ENCOUNTER — Other Ambulatory Visit: Payer: Self-pay

## 2013-04-07 ENCOUNTER — Ambulatory Visit: Payer: Medicare Other | Admitting: Family Medicine

## 2013-04-13 ENCOUNTER — Ambulatory Visit (INDEPENDENT_AMBULATORY_CARE_PROVIDER_SITE_OTHER): Payer: Medicare Other | Admitting: Family Medicine

## 2013-04-13 ENCOUNTER — Encounter: Payer: Self-pay | Admitting: Family Medicine

## 2013-04-13 VITALS — BP 119/72 | HR 67 | Wt 163.0 lb

## 2013-04-13 DIAGNOSIS — F341 Dysthymic disorder: Secondary | ICD-10-CM | POA: Diagnosis not present

## 2013-04-13 DIAGNOSIS — Z1159 Encounter for screening for other viral diseases: Secondary | ICD-10-CM | POA: Diagnosis not present

## 2013-04-13 DIAGNOSIS — Z113 Encounter for screening for infections with a predominantly sexual mode of transmission: Secondary | ICD-10-CM

## 2013-04-13 DIAGNOSIS — R05 Cough: Secondary | ICD-10-CM | POA: Diagnosis not present

## 2013-04-13 DIAGNOSIS — F329 Major depressive disorder, single episode, unspecified: Secondary | ICD-10-CM

## 2013-04-13 DIAGNOSIS — Z114 Encounter for screening for human immunodeficiency virus [HIV]: Secondary | ICD-10-CM

## 2013-04-13 MED ORDER — BUPROPION HCL ER (XL) 150 MG PO TB24: 150.0000 mg | ORAL_TABLET | ORAL | Status: DC

## 2013-04-13 NOTE — Progress Notes (Signed)
CC: Austin Gill is a 27 y.o. male is here for f/u depression and anxiety   Subjective: HPI:  Followup depression and anxiety: Over the past 3 months he been taking Wellbutrin daily without any known side effects. He is happy with almost complete resolution of anxiety and depression. Reports that he is much less reserved about talking to others now however this exacerbates his stuttering and will cause anxiety and depression. Overall he is happy with where he stands right now from a mental health standpoint. Denies thoughts of harming self or others, paranoia, nor hallucinations.  Patient is requesting HIV testing he reports he occasionally gets this. He has no symptoms that he is concerned about which could reflect contracted an STD. He is sexually active  Complains of a cough that he's had for the last 2 days he was in his regular state of health for the past month from a pulmonary standpoint. Cough is nonproductive without fever chills, shortness of breath, nor wheezing. Denies chest pain   Review Of Systems Outlined In HPI  Past Medical History  Diagnosis Date  . GERD (gastroesophageal reflux disease)      No family history on file.   History  Substance Use Topics  . Smoking status: Never Smoker   . Smokeless tobacco: Not on file  . Alcohol Use: No     Objective: Filed Vitals:   04/13/13 0921  BP: 119/72  Pulse: 67   Vital signs reviewed. General: Alert and Oriented, No Acute Distress HEENT: Pupils equal, round, reactive to light. Conjunctivae clear.  External ears unremarkable.  Moist mucous membranes. Middle ear clear and open with unremarkable pharynx Lungs: Clear and comfortable work of breathing, speaking in full sentences without accessory muscle use. No wheezing rhonchi or rales Cardiac: Regular rate and rhythm.  Neuro: CN II-XII grossly intact, gait normal. Extremities: No peripheral edema.  Strong peripheral pulses.  Mental Status: No depression, anxiety, nor  agitation. Logical though process. Skin: Warm and dry.  Assessment & Plan: Austin Gill was seen today for f/u depression and anxiety.  Diagnoses and associated orders for this visit:  Cough  Anxiety and depression - buPROPion (WELLBUTRIN XL) 150 MG 24 hr tablet; Take 1 tablet (150 mg total) by mouth every morning.  Screen for STD (sexually transmitted disease) - HIV antibody - RPR - Hepatitis B surface antigen - Hepatitis C antibody  Screening for HIV (human immunodeficiency virus) - HIV antibody    Cough: No signs of bacterial infection suspect viral bronchitis. Anxiety and depression: Controlled continue Wellbutrin STD screening: Discuss other options of STD screening beyond HIV he would like to also check syphilis and hepatitis labs above  25 minutes spent face-to-face during visit today of which at least 50% was counseling or coordinating care regarding cough, anxiety and depression, STD screening.   Return in about 5 months (around 09/11/2013).

## 2013-04-14 LAB — RPR

## 2013-04-14 LAB — HIV ANTIBODY (ROUTINE TESTING W REFLEX): HIV: NONREACTIVE

## 2013-05-13 IMAGING — CR DG CHEST 2V
2 series · 2 of 2 positions shown · non-contrast
Comparison: 06/13/2010

CLINICAL DATA: Chest tightness

CHEST - 2 VIEW

[view not recorded (1 of 2)]
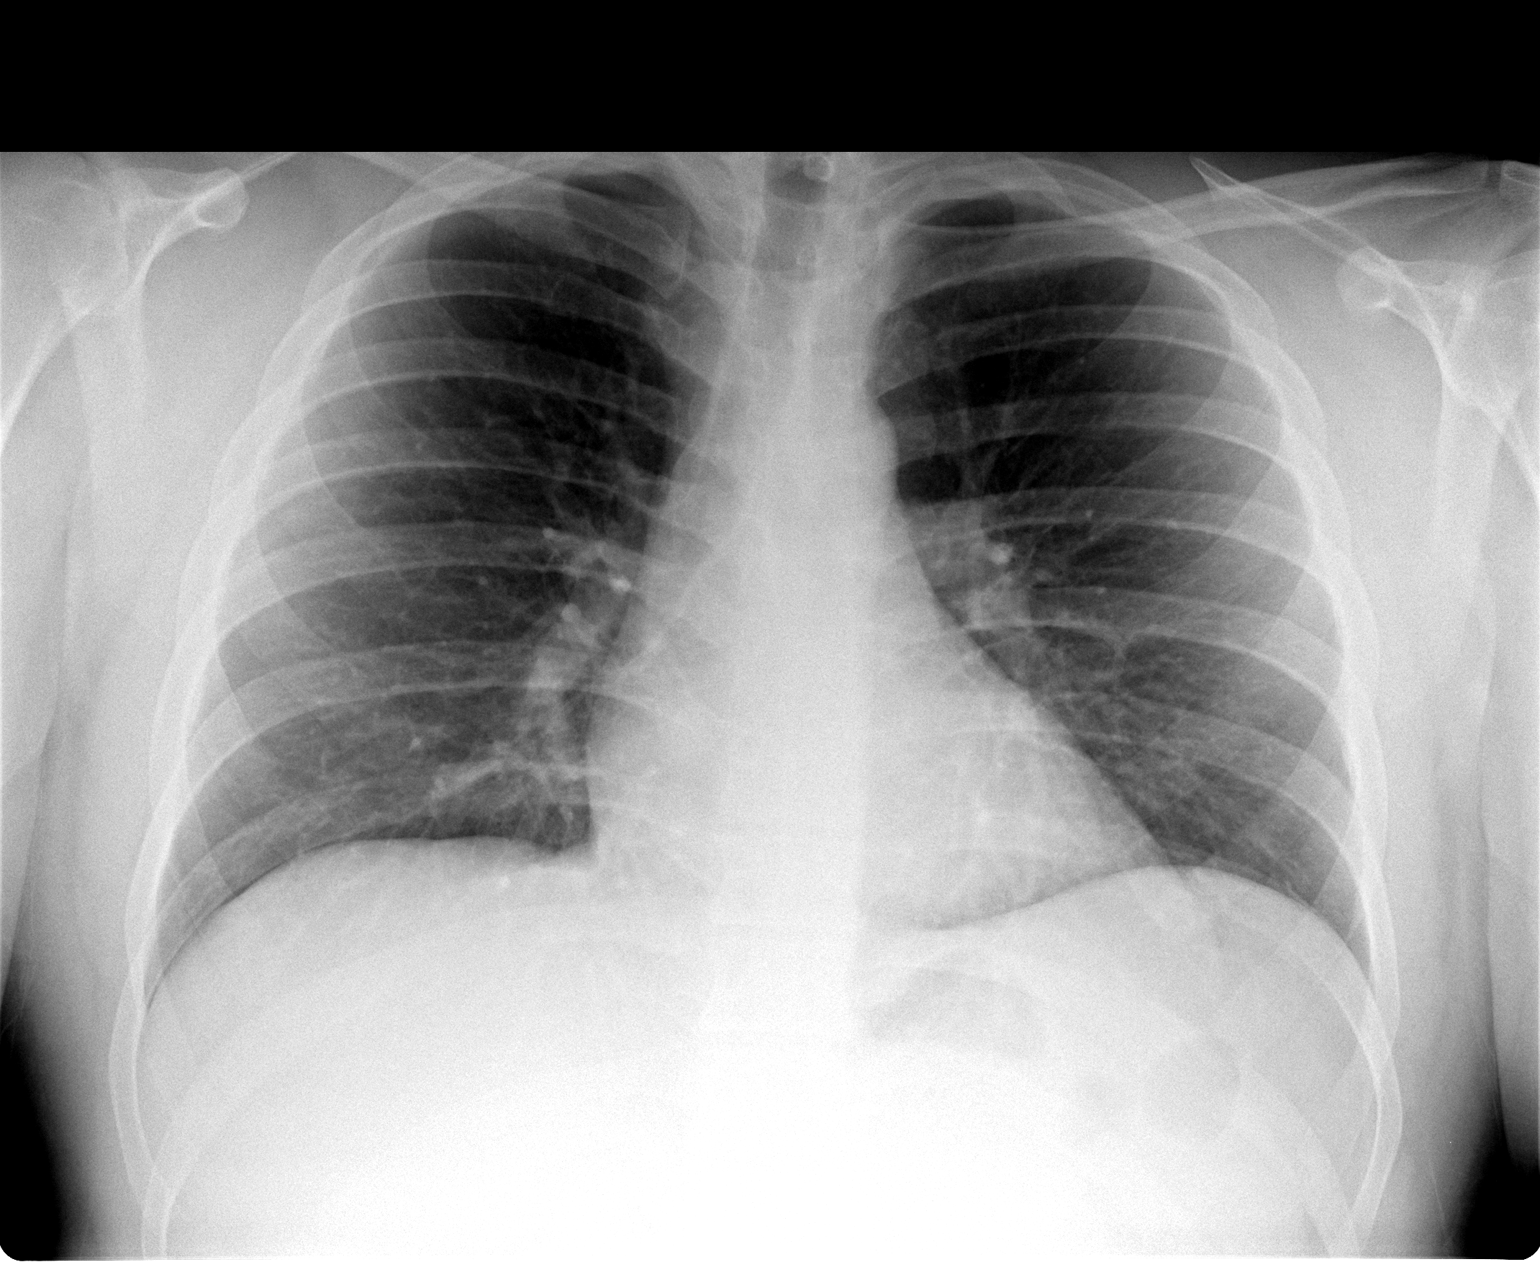

[view not recorded (2 of 2)]
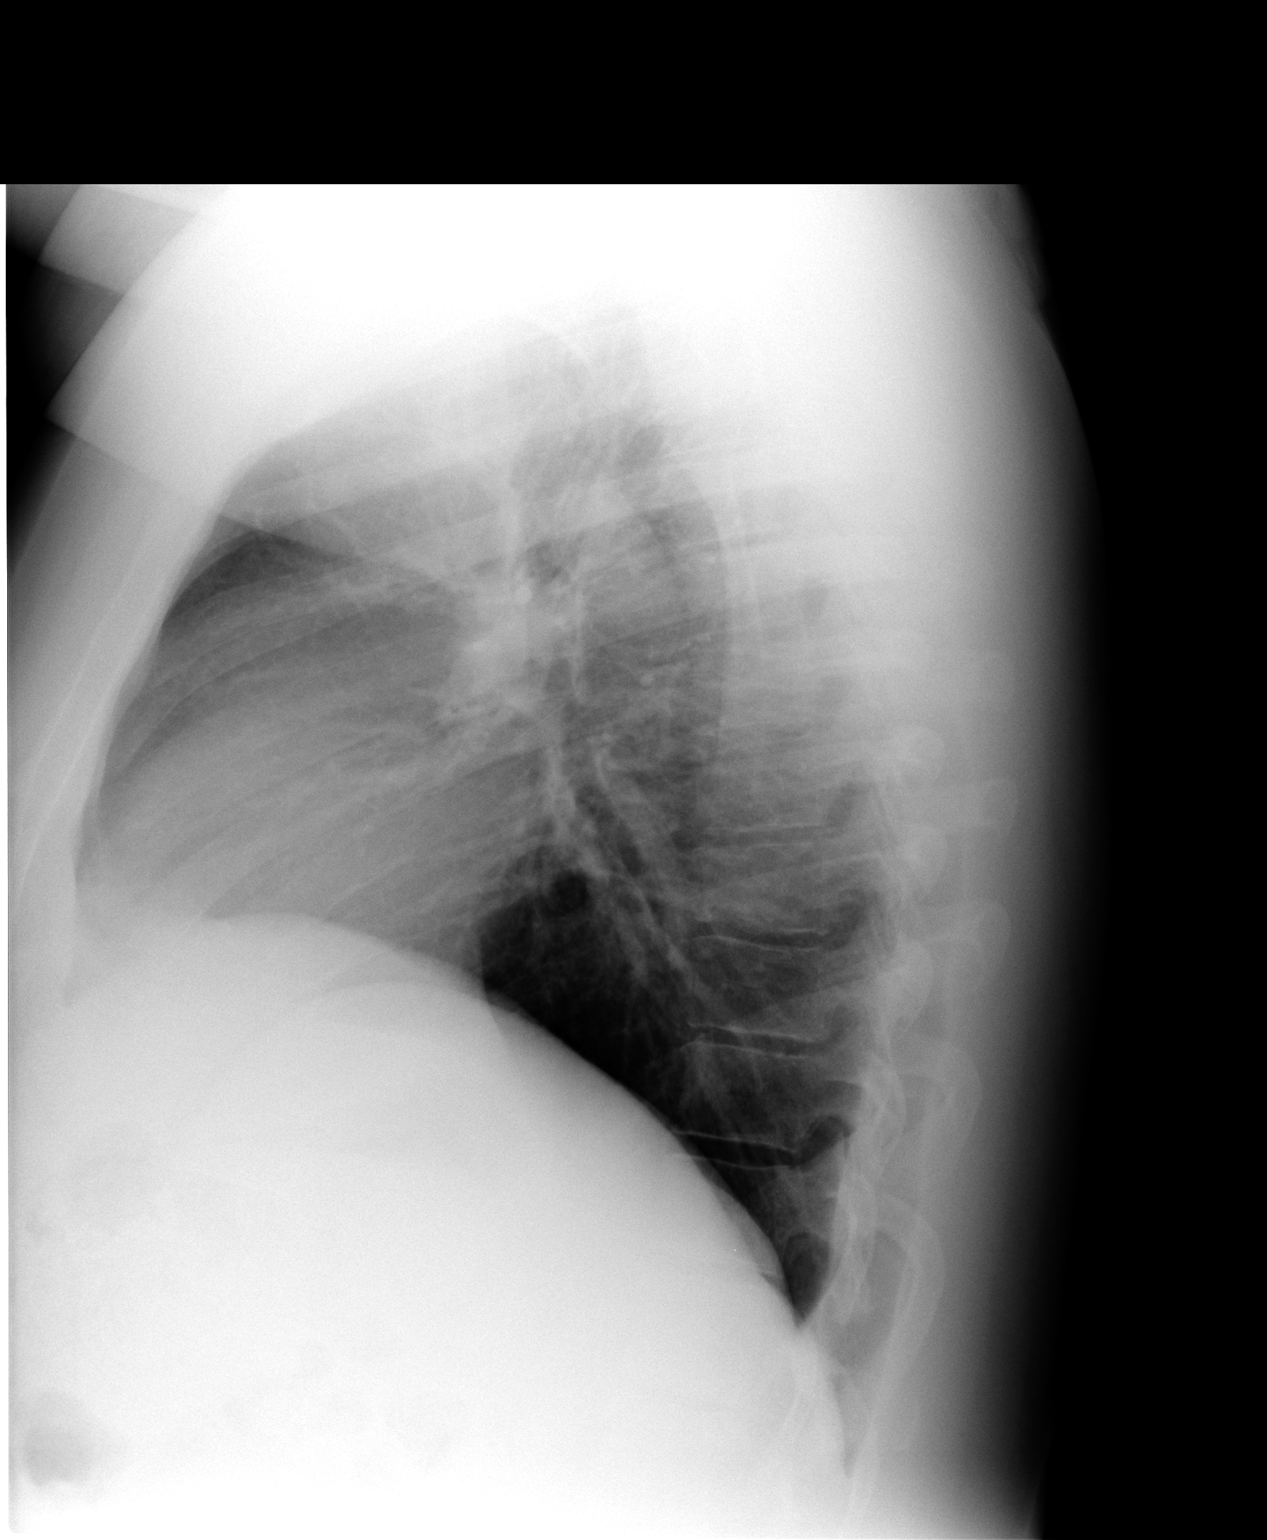

[2 of 2 positions shown; findings below may reference images not displayed]

FINDINGS: Lungs are clear. No pleural effusion or pneumothorax. The
cardiomediastinal contours are within normal limits. The visualized
bones and soft tissues are without significant appreciable
abnormality.
IMPRESSION: No acute cardiopulmonary process.

## 2013-07-13 ENCOUNTER — Encounter: Payer: Self-pay | Admitting: Family Medicine

## 2013-07-13 ENCOUNTER — Ambulatory Visit (INDEPENDENT_AMBULATORY_CARE_PROVIDER_SITE_OTHER): Payer: Medicare Other | Admitting: Family Medicine

## 2013-07-13 VITALS — BP 119/64 | HR 78 | Temp 97.9°F | Wt 157.0 lb

## 2013-07-13 DIAGNOSIS — J029 Acute pharyngitis, unspecified: Secondary | ICD-10-CM

## 2013-07-13 DIAGNOSIS — K529 Noninfective gastroenteritis and colitis, unspecified: Secondary | ICD-10-CM

## 2013-07-13 DIAGNOSIS — F341 Dysthymic disorder: Secondary | ICD-10-CM

## 2013-07-13 DIAGNOSIS — R197 Diarrhea, unspecified: Secondary | ICD-10-CM | POA: Diagnosis not present

## 2013-07-13 DIAGNOSIS — F329 Major depressive disorder, single episode, unspecified: Secondary | ICD-10-CM

## 2013-07-13 DIAGNOSIS — F419 Anxiety disorder, unspecified: Principal | ICD-10-CM

## 2013-07-13 MED ORDER — BUPROPION HCL 75 MG PO TABS: 75.0000 mg | ORAL_TABLET | Freq: Two times a day (BID) | ORAL | Status: DC

## 2013-07-13 NOTE — Progress Notes (Signed)
CC: Austin PurpuraDustin P Gill is a 28 y.o. male is here for Sore Throat   Subjective: HPI:  Acute visit for sore throat has been present off and on for the past 5 days he will come and go on a daily basis from moderate in severity to nonexistent. Slight improvement Chloraseptic spray no other interventions. Accompanied by mild nasal congestion but denies fevers, chills, choking, shortness of breath, chest pain, sinus pressure, nor ear complaints. Pain is absent today  He mentions that for the past month he's having 3 bowel movements a day which are well formed solid and painless. They occur after meals and are predictable. He denies any loose stools or blood nor melena appearance of his stool. Reports a sensation of hunger prior to his defecation. Denies abdominal pain.  Complains of a tremor and unintentional weight loss since starting Wellbutrin accompanied by decreased appetite. Still feels that Wellbutrin is helping anxiety and depression extremely well. Tremor is interfering with quality of life however. He's wondering if there is any adjustment that  Can be done to his Wellbutrin   Review Of Systems Outlined In HPI  Past Medical History  Diagnosis Date  . GERD (gastroesophageal reflux disease)     No past surgical history on file. No family history on file.  History   Social History  . Marital Status: Single    Spouse Name: N/A    Number of Children: N/A  . Years of Education: N/A   Occupational History  . Not on file.   Social History Main Topics  . Smoking status: Never Smoker   . Smokeless tobacco: Not on file  . Alcohol Use: No  . Drug Use: No  . Sexual Activity: No   Other Topics Concern  . Not on file   Social History Narrative  . No narrative on file     Objective: BP 119/64  Pulse 78  Temp(Src) 97.9 F (36.6 C) (Oral)  Wt 157 lb (71.215 kg)  General: Alert and Oriented, No Acute Distress HEENT: Pupils equal, round, reactive to light. Conjunctivae clear.   External ears unremarkable, canals clear with intact TMs with appropriate landmarks.  Middle ear appears open without effusion. Pink inferior turbinates.  Moist mucous membranes, pharynx without inflammation nor lesions.  Neck supple without palpable lymphadenopathy nor abnormal masses. Lungs: Clear to auscultation bilaterally, no wheezing/ronchi/rales.  Comfortable work of breathing. Good air movement. Mental Status: No depression, anxiety, nor agitation. Skin: Warm and dry.  Assessment & Plan: Austin HaileyDustin was seen today for sore throat.  Diagnoses and associated orders for this visit:  Anxiety and depression - buPROPion (WELLBUTRIN) 75 MG tablet; Take 1 tablet (75 mg total) by mouth 2 (two) times daily. Instead of 24h formulation.  Viral pharyngitis  Frequent stools    Viral pharyngitis: Discussed continuing supportive care consider Tylenol or ibuprofen if pain returns Frequent stools: Start with daily probiotics return for visit dedicated to this chief complaint if symptoms persist beyond one to 2 weeks or worsen Anxiety and depression: Controlled however side effects of Wellbutrin therefore switching to immediate release formulation, call if this is beneficial and we will provide with a long-term prescription   Return if symptoms worsen or fail to improve.

## 2013-09-13 ENCOUNTER — Ambulatory Visit: Payer: Medicare Other | Admitting: Family Medicine

## 2013-09-13 DIAGNOSIS — Z0289 Encounter for other administrative examinations: Secondary | ICD-10-CM

## 2013-09-23 ENCOUNTER — Ambulatory Visit: Payer: Medicare Other | Admitting: Family Medicine

## 2013-09-28 ENCOUNTER — Encounter: Payer: Self-pay | Admitting: Family Medicine

## 2013-09-28 ENCOUNTER — Ambulatory Visit (INDEPENDENT_AMBULATORY_CARE_PROVIDER_SITE_OTHER): Payer: Medicare Other | Admitting: Family Medicine

## 2013-09-28 VITALS — BP 118/72 | HR 67 | Wt 153.0 lb

## 2013-09-28 DIAGNOSIS — F341 Dysthymic disorder: Secondary | ICD-10-CM

## 2013-09-28 DIAGNOSIS — F32A Depression, unspecified: Secondary | ICD-10-CM

## 2013-09-28 DIAGNOSIS — G252 Other specified forms of tremor: Secondary | ICD-10-CM

## 2013-09-28 DIAGNOSIS — R059 Cough, unspecified: Secondary | ICD-10-CM

## 2013-09-28 DIAGNOSIS — G250 Essential tremor: Secondary | ICD-10-CM | POA: Diagnosis not present

## 2013-09-28 DIAGNOSIS — F329 Major depressive disorder, single episode, unspecified: Secondary | ICD-10-CM

## 2013-09-28 DIAGNOSIS — R05 Cough: Secondary | ICD-10-CM | POA: Diagnosis not present

## 2013-09-28 DIAGNOSIS — F419 Anxiety disorder, unspecified: Secondary | ICD-10-CM

## 2013-09-28 MED ORDER — KETOROLAC TROMETHAMINE 60 MG/2ML IM SOLN: 60.0000 mg | Freq: Once | INTRAMUSCULAR | Status: DC

## 2013-09-28 MED ORDER — BUPROPION HCL ER (XL) 150 MG PO TB24: 150.0000 mg | ORAL_TABLET | ORAL | Status: AC

## 2013-09-28 NOTE — Progress Notes (Signed)
CC: [name] P Gill is a 28 y.o. male is here for anxiety and depression f/u   Subjective: HPI:  Followup anxiety and depression: He states that anxiety and depression are absent to mild on Wellbutrin 150 long-acting or 75 mg immediate release twice a day. He is happy with his current symptom situation, would like to go back to the long-acting for ease of administration.  Denies any mental disturbance. Denies known side effects or intolerance.  Followup tremor: No improvement or change since switching formulation of Wellbutrin. He talked to his parents about this and his dad had identical symptoms, patient has been suffering from these symptoms since he was a child. He does have not been getting better or worsens onset per his report of her parents report. Localized in both hands, worse with purposeful movements, absent at rest. Does not get in the way of quality of life, no difficulty feeding. Denies rigidity, falls, family history of Parkinson's disease. He does not drink alcohol at all.  Complains of cough that has been present since Tuesday of last week. For the first 3 days it was accompanied by subjective fever, myalgias, nasal congestion. Old that has resolved other than nonproductive cough which is worse at night when lying down. Nothing particularly makes it better or worse other than above. Denies wheezing but does endorse mild shortness of breath when coughing. Denies chest pain. Denies fevers currently denies blood and sputum   Review Of Systems Outlined In HPI  Past Medical History  Diagnosis Date  . GERD (gastroesophageal reflux disease)     No past surgical history on file. No family history on file.  History   Social History  . Marital Status: Single    Spouse Name: N/A    Number of Children: N/A  . Years of Education: N/A   Occupational History  . Not on file.   Social History Main Topics  . Smoking status: Never Smoker   . Smokeless tobacco: Not on file  . Alcohol  Use: No  . Drug Use: No  . Sexual Activity: No   Other Topics Concern  . Not on file   Social History Narrative  . No narrative on file     Objective: BP 118/72  Pulse 67  Wt 153 lb (69.4 kg)  General: Alert and Oriented, No Acute Distress HEENT: Pupils equal, round, reactive to light. Conjunctivae clear.  External ears unremarkable, canals clear with intact TMs with appropriate landmarks.  Middle ear appears open without effusion. Pink inferior turbinates.  Moist mucous membranes, pharynx without inflammation nor lesions.  Neck supple without palpable lymphadenopathy nor abnormal masses. Lungs: Clear to auscultation bilaterally, no wheezing/ronchi/rales.  Comfortable work of breathing. Good air movement. Cardiac: Regular rate and rhythm. Normal S1/S2.  No murmurs, rubs, nor gallops.   Neuro: CN II-XII grossly intact, full strength/rom of all four extremities, gait normal, rapid alternating movements normal,no cogwheeling in upper extremities. Mild high frequency low amplitude tremor of both hands only when outstretched. No purposeful tremor. Extremities: No peripheral edema.  Strong peripheral pulses.  Mental Status: No depression, anxiety, nor agitation. Skin: Warm and dry.  Assessment & Plan: [name] was seen today for anxiety and depression f/u.  Diagnoses and associated orders for this visit:  Cough  Essential tremor  Anxiety and depression - buPROPion (WELLBUTRIN XL) 150 MG 24 hr tablet; Take 1 tablet (150 mg total) by mouth every morning.  Other Orders - Discontinue: ketorolac (TORADOL) injection 60 mg; Inject 2 mLs (60 mg  total) into the muscle once.    Cough: Discussed using guaifenesin, expect that this will linger for another week, discussed post viral cough syndrome. Anxiety and depression: Controlled continue Wellbutrin switching back to long-acting formulation Essential tremor: Discussed benign nature of this condition, option of using propranolol however he  understandably declined.  Please disregard documentation of ketorolac above, this was placed in the wrong chart.    Return in about 6 months (around 03/31/2014).

## 2013-10-15 ENCOUNTER — Telehealth: Payer: Self-pay | Admitting: *Deleted

## 2013-10-15 DIAGNOSIS — F32A Depression, unspecified: Secondary | ICD-10-CM

## 2013-10-15 DIAGNOSIS — F419 Anxiety disorder, unspecified: Principal | ICD-10-CM

## 2013-10-15 DIAGNOSIS — F329 Major depressive disorder, single episode, unspecified: Secondary | ICD-10-CM

## 2013-10-15 NOTE — Telephone Encounter (Signed)
Pt's father called and wanted to know if pt could be referred to someone in re to his mental health. His father states he is having a lot of "trouble coping with things"

## 2013-10-15 NOTE — Telephone Encounter (Signed)
Of course, Referral has bee nplaced, please contact me if not called about scheduling within the next week.

## 2013-10-15 NOTE — Telephone Encounter (Signed)
Pt.notified

## 2013-10-29 ENCOUNTER — Telehealth: Payer: Self-pay | Admitting: *Deleted

## 2013-10-29 NOTE — Telephone Encounter (Signed)
Pt's father called and states he had not heard anything back about an appointment. For some reason when its a behavioral health referral I cannot see the referral in order to give pt any type of updates on status of ref.Can you please see if  they are working on this downstairs and let Austin Gill know?

## 2013-10-29 NOTE — Telephone Encounter (Signed)
noted 

## 2013-10-29 NOTE — Telephone Encounter (Signed)
Sue LushAndrea, I [facility] and she states that she has this patients referral to work on and she will call the patient. Thanks

## 2013-11-02 ENCOUNTER — Emergency Department (INDEPENDENT_AMBULATORY_CARE_PROVIDER_SITE_OTHER)
Admission: EM | Admit: 2013-11-02 | Discharge: 2013-11-02 | Disposition: A | Discharge status: Home / Self Care | Payer: Medicare Other | Source: Home / Self Care | Attending: Emergency Medicine | Admitting: Emergency Medicine

## 2013-11-02 ENCOUNTER — Encounter: Payer: Self-pay | Admitting: Emergency Medicine

## 2013-11-02 DIAGNOSIS — B029 Zoster without complications: Secondary | ICD-10-CM | POA: Diagnosis not present

## 2013-11-02 MED ORDER — VALACYCLOVIR HCL 1 G PO TABS: ORAL_TABLET | ORAL | Status: DC

## 2013-11-02 MED ORDER — PREDNISONE (PAK) 10 MG PO TABS: ORAL_TABLET | ORAL | Status: DC

## 2013-11-02 NOTE — ED Notes (Signed)
Thursday 6 days ago noticed stabbing pain on left side of neck, red painful rash developed.

## 2013-11-02 NOTE — Discharge Instructions (Signed)
Shingles °Shingles (herpes zoster) is an infection that is caused by the same virus that causes chickenpox (varicella). The infection causes a painful skin rash and fluid-filled blisters, which eventually break open, crust over, and heal. It may occur in any area of the body, but it usually affects only one side of the body or face. The pain of shingles usually lasts about 1 month. However, some people with shingles may develop long-term (chronic) pain in the affected area of the body. °Shingles often occurs many years after the person had chickenpox. It is more common: °· In people older than 50 years. °· In people with weakened immune systems, such as those with HIV, AIDS, or cancer. °· In people taking medicines that weaken the immune system, such as transplant medicines. °· In people under great stress. °CAUSES  °Shingles is caused by the varicella zoster virus (VZV), which also causes chickenpox. After a person is infected with the virus, it can remain in the person's body for years in an inactive state (dormant). To cause shingles, the virus reactivates and breaks out as an infection in a nerve root. °The virus can be spread from person to person (contagious) through contact with open blisters of the shingles rash. It will only spread to people who have not had chickenpox. When these people are exposed to the virus, they may develop chickenpox. They will not develop shingles. Once the blisters scab over, the person is no longer contagious and cannot spread the virus to others. °SYMPTOMS  °Shingles shows up in stages. The initial symptoms may be pain, itching, and tingling in an area of the skin. This pain is usually described as burning, stabbing, or throbbing. In a few days or weeks, a painful red rash will appear in the area where the pain, itching, and tingling were felt. The rash is usually on one side of the body in a band or belt-like pattern. Then, the rash usually turns into fluid-filled blisters. They  will scab over and dry up in approximately 2-3 weeks. °Flu-like symptoms may also occur with the initial symptoms, the rash, or the blisters. These may include: °· Fever. °· Chills. °· Headache. °· Upset stomach. °DIAGNOSIS  °Your caregiver will perform a skin exam to diagnose shingles. Skin scrapings or fluid samples may also be taken from the blisters. This sample will be examined under a microscope or sent to a lab for further testing. °TREATMENT  °There is no specific cure for shingles. Your caregiver will likely prescribe medicines to help you manage the pain, recover faster, and avoid long-term problems. This may include antiviral drugs, anti-inflammatory drugs, and pain medicines. °HOME CARE INSTRUCTIONS  °· Take a cool bath or apply cool compresses to the area of the rash or blisters as directed. This may help with the pain and itching.   °· Only take over-the-counter or prescription medicines as directed by your caregiver.   °· Rest as directed by your caregiver. °· Keep your rash and blisters clean with mild soap and cool water or as directed by your caregiver.  °· Do not pick your blisters or scratch your rash. Apply an anti-itch cream or numbing creams to the affected area as directed by your caregiver. °· Keep your shingles rash covered with a loose bandage (dressing). °· Avoid skin contact with: °¨ Babies.   °¨ Pregnant women.   °¨ Children with eczema.   °¨ Elderly people with transplants.   °¨ People with chronic illnesses, such as leukemia or AIDS.   °· Wear loose-fitting clothing to help ease the   pain of material rubbing against the rash. °· Keep all follow-up appointments with your caregiver. If the area involved is on your face, you may receive a referral for follow-up to a specialist, such as an eye doctor (ophthalmologist) or an ear, nose, and throat (ENT) doctor. Keeping all follow-up appointments will help you avoid eye complications, chronic pain, or disability.   °SEEK IMMEDIATE MEDICAL  CARE IF:  °· You have facial pain, pain around the eye area, or loss of feeling on one side of your face. °· You have ear pain or ringing in your ear. °· You have loss of taste. °· Your pain is not relieved with prescribed medicines.   °· Your redness or swelling spreads.   °· You have more pain and swelling.  °· Your condition is worsening or has changed.   °· You have a fever or persistent symptoms for more than 2-3 days. °· You have a fever and your symptoms suddenly get worse. °MAKE SURE YOU: °· Understand these instructions. °· Will watch your condition. °· Will get help right away if you are not doing well or get worse. °Document Released: 04/22/2005 Document Revised: 01/15/2012 Document Reviewed: 12/05/2011 °[employer]® Patient Information ©2015 [employer]. This information is not intended to replace advice given to you by your health care provider. Make sure you discuss any questions you have with your health care provider. ° °

## 2013-11-02 NOTE — ED Provider Notes (Signed)
CSN: [id number]     Arrival date & time 11/02/13  1010 History   First MD Initiated Contact with Patient 11/02/13 1017     Chief Complaint  Patient presents with  . Rash   (Consider location/radiation/quality/duration/timing/severity/associated sxs/prior Treatment) HPI About 4 days ago, began with a stabbing severe pain left side of neck. Then, 2 days ago painful slightly blistery rash developed in the same location. Tried OTC Tylenol or ibuprofen without significant relief . He states immunizations up-to-date, but has never had shingles. Denies systemic symptoms. No fever or chills or URI symptoms. Past Medical History  Diagnosis Date  . GERD (gastroesophageal reflux disease)    History reviewed. No pertinent past surgical history. No family history on file. History  Substance Use Topics  . Smoking status: Never Smoker   . Smokeless tobacco: Not on file  . Alcohol Use: No    Review of Systems  All other systems reviewed and are negative.   Allergies  Buspar and Paxil  Home Medications   Prior to Admission medications   Medication Sig Start Date End Date Taking? Authorizing Provider  buPROPion (WELLBUTRIN XL) 150 MG 24 hr tablet Take 1 tablet (150 mg total) by mouth every morning. 09/28/13 09/28/14  Laren BoomSean Hommel, DO  cetirizine (ZYRTEC ALLERGY) 10 MG tablet Take 1 tablet (10 mg total) by mouth daily. 01/06/13   Sean Hommel, DO  omeprazole (PRILOSEC) 20 MG capsule Take 20 mg by mouth daily.      Historical Provider, MD  predniSONE (STERAPRED UNI-PAK) 10 MG tablet Take as directed for 6 days.--Take 6 on day 1, 5 on day 2, 4 on day 3, then 3 tablets on day 4, then 2 tablets on day 5, then 1 on day 6. 11/02/13   Lajean Manesavid Massey, MD  valACYclovir (VALTREX) 1000 MG tablet Take one by mouth 3 times a day for 7 days 11/02/13   Lajean Manesavid Massey, MD   BP 136/75  Pulse 68  Temp(Src) 98.2 F (36.8 C) (Oral)  Ht 5\' 7"  (1.702 m)  Wt 154 lb (69.854 kg)  BMI 24.11 kg/m2  SpO2 99% Physical Exam   Nursing note and vitals reviewed. Constitutional: He is oriented to person, place, and time. He appears well-developed and well-nourished. No distress.  Uncomfortable from left lateral neck pain. Alert, cooperative. Prominence stutter.  HENT:  Head: Normocephalic and atraumatic.  Eyes: Conjunctivae and EOM are normal. Pupils are equal, round, and reactive to light. No scleral icterus.  Neck: Normal range of motion.  Cardiovascular: Normal rate.   Pulmonary/Chest: Effort normal.  Abdominal: He exhibits no distension.  Musculoskeletal: Normal range of motion.  Neurological: He is alert and oriented to person, place, and time. No cranial nerve deficit.  Skin: Skin is warm.  Red, vesicular eruption in dermatomal distribution left posterior-lateral neck.----Does not cross the midline No fluctuance or drainage . No bony tenderness  Psychiatric: He has a normal mood and affect.    ED Course  Procedures (including critical care time) Labs Review Labs Reviewed - No data to display  Imaging Review No results found.   MDM   1. Shingles outbreak    Treatment options discussed, as well as risks, benefits, alternatives. Patient voiced understanding and agreement with the following plans: Valtrex 1 g 3 times a day x7 days Prednisone 10 mg-6 day Dosepak Other symptomatic care discussed Follow-up with your primary care doctor in 5-7 days if not improving, or sooner if symptoms become worse. Precautions discussed. Red flags discussed. Questions invited  and answered. Patient voiced understanding and agreement.     Lajean Manesavid Massey, MD 11/02/13 [phone]

## 2013-11-04 ENCOUNTER — Telehealth: Payer: Self-pay | Admitting: *Deleted

## 2013-11-04 MED ORDER — HYDROCODONE-ACETAMINOPHEN 5-325 MG PO TABS: 1.0000 | ORAL_TABLET | ORAL | Status: DC | PRN

## 2013-11-04 MED ORDER — GABAPENTIN 300 MG PO CAPS: ORAL_CAPSULE | ORAL | Status: DC

## 2013-11-04 NOTE — Telephone Encounter (Signed)
Pt was dx with shingles in UC yesterday. He was rx'ed valtrex but nothing for pain. Pt states the pain is unbearable today. He would like something for pain.Please advise

## 2013-11-04 NOTE — Telephone Encounter (Signed)
Pt notified and voiced undertanding

## 2013-11-04 NOTE — Telephone Encounter (Signed)
Gabapentin prescribed in a steady up taper, have him be sure to follow the directions carefully on how to increase the dose of the medication.

## 2013-11-05 ENCOUNTER — Telehealth: Payer: Self-pay | Admitting: Emergency Medicine

## 2013-11-05 MED ORDER — ACYCLOVIR 5 % EX OINT: 1.0000 "application " | TOPICAL_OINTMENT | CUTANEOUS | Status: DC

## 2013-11-06 ENCOUNTER — Encounter: Payer: Self-pay | Admitting: Emergency Medicine

## 2013-11-06 ENCOUNTER — Emergency Department
Admission: EM | Admit: 2013-11-06 | Discharge: 2013-11-06 | Disposition: A | Discharge status: Home / Self Care | Payer: Medicare Other | Source: Home / Self Care

## 2013-11-06 NOTE — ED Notes (Signed)
Patient has called about surrounding shingles issues over past 2 days; requesting pain medication>rx for Vicodin; concerned about facial itching>Valtrex ointment (not started yet). Wondering if facial itching an allergic reaction to the Vicodin.

## 2013-11-09 ENCOUNTER — Ambulatory Visit (HOSPITAL_COMMUNITY): Payer: Medicare Other | Admitting: Psychiatry

## 2013-11-09 ENCOUNTER — Telehealth: Payer: Self-pay | Admitting: *Deleted

## 2013-11-12 ENCOUNTER — Ambulatory Visit (INDEPENDENT_AMBULATORY_CARE_PROVIDER_SITE_OTHER): Payer: Medicare Other | Admitting: Family Medicine

## 2013-11-12 ENCOUNTER — Encounter: Payer: Self-pay | Admitting: Family Medicine

## 2013-11-12 VITALS — BP 116/72 | HR 70 | Wt 161.0 lb

## 2013-11-12 DIAGNOSIS — B029 Zoster without complications: Secondary | ICD-10-CM

## 2013-11-12 DIAGNOSIS — Z202 Contact with and (suspected) exposure to infections with a predominantly sexual mode of transmission: Secondary | ICD-10-CM

## 2013-11-12 DIAGNOSIS — Z113 Encounter for screening for infections with a predominantly sexual mode of transmission: Secondary | ICD-10-CM

## 2013-11-12 NOTE — Progress Notes (Signed)
CC: Austin Gill is a 28 y.o. male is here for f/u UC visit   Subjective: HPI:  Followup shingles: 2 weeks ago he developed a painful raised rash on his left neck and left shoulder. Since it began he has been on oral Valtrex regimens twice, a prednisone taper, and was taken occasional hydrocodone. He says the rash is becoming a state since this week began in the pain is significantly better but still moderate in severity described as stinging and sharpness. Denies new rashes elsewhere. Reports generalized myalgias since this all came on but no other accompanying symptoms.  Denies fevers, chills, flushing, nor any motor or sensory disturbances of not described above. He never tried gabapentin.  Patient reports possibility that he was exposed to an STD with a recent sexual encounter. He would like to know he can be screened for HIV or HSV 1 and 2 infections.  He denies any specific symptoms that are concerning him he would like to plate safe period is been no penile discharge,  norunintentional weight loss.   Review Of Systems Outlined In HPI  Past Medical History  Diagnosis Date  . GERD (gastroesophageal reflux disease)     No past surgical history on file. No family history on file.  History   Social History  . Marital Status: Single    Spouse Name: N/A    Number of Children: N/A  . Years of Education: N/A   Occupational History  . Not on file.   Social History Main Topics  . Smoking status: Never Smoker   . Smokeless tobacco: Not on file  . Alcohol Use: No  . Drug Use: No  . Sexual Activity: No   Other Topics Concern  . Not on file   Social History Narrative  . No narrative on file     Objective: BP 116/72  Pulse 70  Wt 161 lb (73.029 kg)  General: Alert and Oriented, No Acute Distress HEENT: Pupils equal, round, reactive to light. Conjunctivae clear.  External ears unremarkable, canals clear with intact TMs with  moist mucous membranes pharynx unremarkable   Lungs: Clear to auscultation bilaterally, no wheezing/ronchi/rales.  Comfortable work of breathing. Good air movement. Cardiac: Regular rate and rhythm. Normal S1/S2.  No murmurs, rubs, nor gallops.   Extremities: No peripheral edema.  Strong peripheral pulses.  Mental Status: No depression, anxiety, nor agitation. Skin: Warm and dry. 2 cm diameter erythematous patch on the left lower lateral neck, 1 cm diameter erythematous patch on the superior lateral left shoulder.  Assessment & Plan: Austin Gill was seen today for f/u uc visit.  Diagnoses and associated orders for this visit:  Shingles  Screen for STD (sexually transmitted disease) - HSV 1 antibody, IgG - HSV 2 antibody, IgG - HIV antibody  STD exposure - HSV 1 antibody, IgG - HSV 2 antibody, IgG - HIV antibody    Shingles: Improving, I discussed with him the complete resolution of pain will take months to occur however starting on gabapentin should help significantly decrease postherpetic neuralgia pain. If this does not help after one to 2 weeks we can always try Lyrica next.    Return if symptoms worsen or fail to improve.

## 2013-11-13 LAB — HIV ANTIBODY (ROUTINE TESTING W REFLEX): HIV: NONREACTIVE

## 2013-11-15 LAB — HSV 2 ANTIBODY, IGG

## 2013-11-15 LAB — HSV 1 ANTIBODY, IGG: HSV 1 Glycoprotein G Ab, IgG: 0.39 IV

## 2013-11-22 ENCOUNTER — Ambulatory Visit: Payer: Self-pay | Admitting: Family Medicine

## 2013-11-23 ENCOUNTER — Ambulatory Visit (INDEPENDENT_AMBULATORY_CARE_PROVIDER_SITE_OTHER): Payer: Medicare Other | Admitting: Psychiatry

## 2013-11-23 ENCOUNTER — Encounter (HOSPITAL_COMMUNITY): Payer: Self-pay | Admitting: Psychiatry

## 2013-11-23 VITALS — BP 105/65 | HR 72 | Ht 67.0 in | Wt 160.0 lb

## 2013-11-23 DIAGNOSIS — F4010 Social phobia, unspecified: Secondary | ICD-10-CM | POA: Diagnosis not present

## 2013-11-23 DIAGNOSIS — F4323 Adjustment disorder with mixed anxiety and depressed mood: Secondary | ICD-10-CM | POA: Insufficient documentation

## 2013-11-23 DIAGNOSIS — F341 Dysthymic disorder: Secondary | ICD-10-CM | POA: Diagnosis not present

## 2013-11-23 NOTE — Progress Notes (Signed)
Patient ID: Austin Gill, male   DOB: 1985-08-02, 28 y.o.   MRN: [id number]  [facility] Initial Psychiatric Assessment   Austin Gill [id number] 28 y.o.  11/23/2013 1:43 PM  Chief Complaint:  Depression  History of Present Illness:   Patient Presents for Initial Evaluation with symptoms of depression. He follows with Dr. Ivan Anchors and recently his disability got disapproved. He has been on disability because of his doctor for the last 5 years. He got gravely concerned and went into depression stressed out and is having possible suicide. He was referred by his primary care physician because of concern of depression. His Wellbutrin was also adjusted increased and changed to long-acting which since has helped.  In regarding to depression he was feeling withdrawn disturbed sleep disturbed energy and appetite unable to focus and was having possible suicide he said he did not attempted. There is no associated psychotic symptoms. He does endorse some low-grade depression for many years with disturbed sleep and energy level sometimes he feels a motivated.  He also worries about his physical condition that is he stutters he feels uncomfortable in social gatherings and avoids situations in which it may be loving people. He has been stuttering since age 28.  Aggravating factors; stutter. Put off from disability recently. Thinking about his physical health. Modifying factors; going to school and having support from his parents.  Duration of depression; since age 28  Severity of depression; 5/10. 10 being very happy  Severity of anxiety; 5/10. 10 being very anxious  Context; his condition of stutter and adjusting to it.  There is no associated takes brother movements except tremors. It is to note that his father also has tremors and he is followed with his primary care physician in regard to the.  There is no prior history of psychotic symptoms delusions hallucinations. No racing thoughts  or manic like symptoms.  He is feeling comfortable with the Wellbutrin that was increased does not endorse hopelessness or having any possible suicide. He feels that he has gone better since then and would not hurt himself and does not want to have any further change in this medication.     Past Psychiatric History/Hospitalization(s) Denies any past psychiatric admissions. He is seeing a psychiatrist when he was younger around age 28 he has been on Paxil and Prozac but it did not help him. He feels comfortable with the Wellbutrin and has not regularly followed with a psychiatrist were gone through ongoing therapy  Hospitalization for psychiatric illness: No History of Electroconvulsive Shock Therapy: No Prior Suicide Attempts: No  Medical History; Past Medical History  Diagnosis Date  . GERD (gastroesophageal reflux disease)   . Anxiety   . Depression   . Stutter     Allergies: Allergies  Allergen Reactions  . Buspar [Buspirone]     Nausea  . Paxil [Paroxetine Hcl]     Suicidal thoughts    Medications: Outpatient Encounter Prescriptions as of 11/23/2013  Medication Sig  . acyclovir ointment (ZOVIRAX) 5 % Apply 1 application topically every 3 (three) hours.  Marland Kitchen buPROPion (WELLBUTRIN XL) 150 MG 24 hr tablet Take 1 tablet (150 mg total) by mouth every morning.  . cetirizine (ZYRTEC ALLERGY) 10 MG tablet Take 1 tablet (10 mg total) by mouth daily.  Marland Kitchen gabapentin (NEURONTIN) 300 MG capsule One tab PO qHS for a week, then BID for a week, then TID. May double weekly to a max of 3,600mg /day  . HYDROcodone-acetaminophen (NORCO/VICODIN) 5-325 MG per  tablet Take 1-2 tablets by mouth every 4 (four) hours as needed for severe pain. Take with food.  Marland Kitchen omeprazole (PRILOSEC) 20 MG capsule Take 20 mg by mouth daily.     Recently he was diagnosed as with shingles. He's not on the pain medication any more. He feels comfortable with the Wellbutrin and has refills  Substance Abuse  History:  Past use of marijuana in 2011.Marland Kitchen Has not been using it since then. A regular and infrequent use of alcohol Family History; Denies family history of pscychiatric condition or suicide.  History reviewed. No pertinent family history.    Biopsychosocial History:  He grew up with his parents he had 5 siblings he is the youngest. He did finish high school had some difficulty in high school and middle school because of his stutter. He felt people make fun of him. He does not have any flashbacks related to the death and he did finish his education currently he is enrolled in IT program in college.  There's no legal issues currently is not married he is living with his parents he has been on jobs in the past but would have difficulty maintaining and keeping jobs because of his condition of stutter and had difficulty to be around people or perform.    Labs:  Recent Results (from the past 2160 hour(s))  HSV 1 ANTIBODY, IGG     Status: None   Collection Time    11/12/13 11:24 AM      Result Value Ref Range   HSV 1 Glycoprotein G Ab, IgG 0.39     Comment:          IV = Index Value                  < 0.90 IV              Negative                  0.90-1.10 IV           Equivocal                  > 1.10 IV              Positive  HSV 2 ANTIBODY, IGG     Status: None   Collection Time    11/12/13 11:24 AM      Result Value Ref Range   HSV 2 Glycoprotein G Ab, IgG <0.10     Comment:          IV = Index Value                  < 0.90 IV              Negative                  0.90-1.10 IV           Equivocal                  > 1.10 IV              Positive  HIV ANTIBODY (ROUTINE TESTING)     Status: None   Collection Time    11/12/13 11:24 AM      Result Value Ref Range   HIV 1&2 Ab, 4th Generation NONREACTIVE  NONREACTIVE   Comment:       A NONREACTIVE HIV Ag/Ab result does not exclude HIV infection since  the time frame for seroconversion is variable. If acute HIV infection      is suspected, a HIV-1 RNA Qualitative TMA test is recommended.           HIV-1/2 Antibody Diff         Not indicated.     HIV-1 RNA, Qual TMA           Not indicated.           PLEASE NOTE: This information has been disclosed to you from records     whose confidentiality may be protected by state law. If your state     requires such protection, then the state law prohibits you from making     any further disclosure of the information without the specific written     consent of the person to whom it pertains, or as otherwise permitted     by law. A general authorization for the release of medical or other     information is NOT sufficient for this purpose.           The performance of this assay has not been clinically validated in     patients less than 30 years old.       Musculoskeletal: Strength & Muscle Tone: within normal limits Gait & Station: normal Patient leans: N/A  Mental Status Examination;   Psychiatric Specialty Exam: Physical Exam  Vitals reviewed. Constitutional: He appears well-developed and well-nourished.  HENT:  Head: Normocephalic and atraumatic.  Skin: He is not diaphoretic.    Review of Systems  Constitutional: Negative.   Neurological: Positive for tremors. Negative for dizziness and tingling.  Psychiatric/Behavioral: Positive for depression. Negative for suicidal ideas, hallucinations, memory loss and substance abuse. The patient is nervous/anxious. The patient does not have insomnia.     Blood pressure 105/65, pulse 72, height 5\' 7"  (1.702 m), weight 160 lb (72.576 kg).Body mass index is 25.05 kg/(m^2).  General Appearance: Casual  Eye Contact::  Fair  Speech:  Stutters. worsened with anxiety and apprehension  Volume:  Decreased  Mood:  Anxious  Affect:  Congruent  Thought Process:  Coherent  Orientation:  Full (Time, Place, and Person)  Thought Content:  no hallucinations or psychotic symptoms  Suicidal Thoughts:  No  Homicidal  Thoughts:  No  Memory:  Immediate;   Fair Recent;   Fair  Judgement:  Fair  Insight:  Fair  Psychomotor Activity:  Normal and Tremor  Concentration:  Fair  Recall:  Fair  Akathisia:  Negative  Handed:  Right  AIMS (if indicated):     Assets:  Desire for Improvement Social Support  Sleep:        Assessment: Axis I: Adjustment disorder with depressed mood anxiety. Social anxiety disorder. Dysthymia. Rule out anxiety secondary to general medical condition.  Axis II: Deferred  Axis III:  Past Medical History  Diagnosis Date  . GERD (gastroesophageal reflux disease)   . Anxiety   . Depression   . Stutter     Axis IV: Psychosocial, physical   Treatment Plan and Summary: He can continue Wellbutrin 150 mg he feels very comfortable he feels the primary care physician can continue prescribing it but he would like to follow a 2 or 3 months with this office. In case he feels suicidal he can call 911 or also called in for the appointment for any worsening of symptoms. If not interested in therapy. We talked about rejoining a support group for stutter he said that he  has done that it did not help much Pertinent Labs and Relevant Prior Notes reviewed. Medication Side effects, benefits and risks reviewed/discussed with Patient. Time given for patient to respond and asks questions regarding the Diagnosis and Medications. Safety concerns and to report to ER if suicidal or call 911. Relevant Medications refilled or called in to pharmacy. Discussed weight maintenance and Sleep Hygiene. Follow up with Primary care provider in regards to Medical conditions. Recommend compliance with medications and follow up office appointments. Discussed to avail opportunity to consider or/and continue Individual therapy with Counselor. Greater than 50% of time was spend in counseling and coordination of care with the patient.  Schedule for Follow up visit in 3 months or call in earlier as  necessary.   Thresa Ross, MD 11/23/2013

## 2013-12-03 ENCOUNTER — Encounter (HOSPITAL_COMMUNITY): Payer: Self-pay

## 2014-02-14 ENCOUNTER — Ambulatory Visit: Payer: Self-pay | Admitting: Family Medicine

## 2014-02-16 ENCOUNTER — Encounter: Payer: Self-pay | Admitting: Emergency Medicine

## 2014-02-16 ENCOUNTER — Emergency Department
Admission: EM | Admit: 2014-02-16 | Discharge: 2014-02-16 | Disposition: A | Discharge status: Home / Self Care | Payer: Self-pay | Source: Home / Self Care | Attending: Family Medicine | Admitting: Family Medicine

## 2014-02-16 DIAGNOSIS — R05 Cough: Secondary | ICD-10-CM

## 2014-02-16 DIAGNOSIS — R059 Cough, unspecified: Secondary | ICD-10-CM

## 2014-02-16 MED ORDER — IPRATROPIUM BROMIDE 0.06 % NA SOLN: 2.0000 | Freq: Four times a day (QID) | NASAL | Status: DC

## 2014-02-16 MED ORDER — TRAMADOL HCL 50 MG PO TABS: 50.0000 mg | ORAL_TABLET | Freq: Every evening | ORAL | Status: DC | PRN

## 2014-02-16 NOTE — Discharge Instructions (Signed)
Thank you for coming in today. Call or go to the emergency room if you get worse, have trouble breathing, have chest pains, or palpitations.  Use tramadol for cough as needed. Take at bedtime. Do not drive after taking tramadol.  Use atrovent nasal spray as needed.  Take tylenol or ibuprofen as needed.    Cough, Adult  A cough is a reflex that helps clear your throat and airways. It can help heal the body or may be a reaction to an irritated airway. A cough may only last 2 or 3 weeks (acute) or may last more than 8 weeks (chronic).  CAUSES Acute cough:  Viral or bacterial infections. Chronic cough:  Infections.  Allergies.  Asthma.  Post-nasal drip.  Smoking.  Heartburn or acid reflux.  Some medicines.  Chronic lung problems (COPD).  Cancer. SYMPTOMS   Cough.  Fever.  Chest pain.  Increased breathing rate.  High-pitched whistling sound when breathing (wheezing).  Colored mucus that you cough up (sputum). TREATMENT   A bacterial cough may be treated with antibiotic medicine.  A viral cough must run its course and will not respond to antibiotics.  Your caregiver may recommend other treatments if you have a chronic cough. HOME CARE INSTRUCTIONS   Only take over-the-counter or prescription medicines for pain, discomfort, or fever as directed by your caregiver. Use cough suppressants only as directed by your caregiver.  Use a cold steam vaporizer or humidifier in your bedroom or home to help loosen secretions.  Sleep in a semi-upright position if your cough is worse at night.  Rest as needed.  Stop smoking if you smoke. SEEK IMMEDIATE MEDICAL CARE IF:   You have pus in your sputum.  Your cough starts to worsen.  You cannot control your cough with suppressants and are losing sleep.  You begin coughing up blood.  You have difficulty breathing.  You develop pain which is getting worse or is uncontrolled with medicine.  You have a fever. MAKE SURE  YOU:   Understand these instructions.  Will watch your condition.  Will get help right away if you are not doing well or get worse. Document Released: 10/19/2010 Document Revised: 07/15/2011 Document Reviewed: 10/19/2010 [facility] Patient Information 2015 [place]. This information is not intended to replace advice given to you by your health care provider. Make sure you discuss any questions you have with your health care provider.

## 2014-02-16 NOTE — ED Provider Notes (Signed)
Austin Gill is a 28 y.o. male who presents to Urgent Care today for cough. Patient developed a mild non-reduction cough yesterday evening. No fevers chills nausea vomiting or diarrhea. No shortness of breath. Patient has mild sharp throat pain with coughing. No true chest pain. No exertional component. He's tried no medications yet. He's been exposed to someone with pneumonia recently.    Past Medical History  Diagnosis Date  . GERD (gastroesophageal reflux disease)   . Anxiety   . Depression   . Stutter    History  Substance Use Topics  . Smoking status: Never Smoker   . Smokeless tobacco: Not on file  . Alcohol Use: No   ROS as above Medications: No current facility-administered medications for this encounter.   Current Outpatient Prescriptions  Medication Sig Dispense Refill  . buPROPion (WELLBUTRIN XL) 150 MG 24 hr tablet Take 1 tablet (150 mg total) by mouth every morning.  90 tablet  2  . cetirizine (ZYRTEC ALLERGY) 10 MG tablet Take 1 tablet (10 mg total) by mouth daily.  30 tablet  1  . gabapentin (NEURONTIN) 300 MG capsule One tab PO qHS for a week, then BID for a week, then TID. May double weekly to a max of 3,600mg /day  180 capsule  3  . ipratropium (ATROVENT) 0.06 % nasal spray Place 2 sprays into both nostrils 4 (four) times daily.  15 mL  1  . omeprazole (PRILOSEC) 20 MG capsule Take 20 mg by mouth daily.        . traMADol (ULTRAM) 50 MG tablet Take 1 tablet (50 mg total) by mouth at bedtime as needed (cough).  10 tablet  0    Exam:  BP 119/77  Pulse 80  Temp(Src) 98.3 F (36.8 C) (Oral)  Wt 162 lb (73.483 kg)  SpO2 98% Gen: Well NAD HEENT: EOMI,  MMM posterior pharynx cobblestoning. Normal tympanic membranes. Clear nasal discharge. Lungs: Normal work of breathing. CTABL Heart: RRR no MRG Abd: NABS, Soft. Nondistended, Nontender Exts: Brisk capillary refill, warm and well perfused.   No results found for this or any previous visit (from the past 24  hour(s)). No results found.  Assessment and Plan: 28 y.o. male with viral URI with cough. Treatment with tramadol for cough and Atrovent nasal spray. Tylenol and ibuprofen as needed.  Discussed warning signs or symptoms. Please see discharge instructions. Patient expresses understanding.     Austin BongEvan S Corey, MD 02/16/14 [phone]

## 2014-02-16 NOTE — ED Notes (Signed)
Cough started last night, chest hurts

## 2014-02-23 ENCOUNTER — Encounter: Payer: Self-pay | Admitting: Family Medicine

## 2014-02-23 ENCOUNTER — Ambulatory Visit (INDEPENDENT_AMBULATORY_CARE_PROVIDER_SITE_OTHER): Payer: Self-pay | Admitting: Family Medicine

## 2014-02-23 ENCOUNTER — Ambulatory Visit (HOSPITAL_COMMUNITY): Payer: Self-pay | Admitting: Psychiatry

## 2014-02-23 VITALS — BP 125/75 | HR 88 | Temp 98.6°F | Wt 159.0 lb

## 2014-02-23 DIAGNOSIS — N5089 Other specified disorders of the male genital organs: Secondary | ICD-10-CM

## 2014-02-23 DIAGNOSIS — N508 Other specified disorders of male genital organs: Secondary | ICD-10-CM

## 2014-02-23 DIAGNOSIS — R062 Wheezing: Secondary | ICD-10-CM

## 2014-02-23 MED ORDER — AZITHROMYCIN 250 MG PO TABS: ORAL_TABLET | ORAL | Status: AC

## 2014-02-23 MED ORDER — PREDNISONE 20 MG PO TABS: ORAL_TABLET | ORAL | Status: AC

## 2014-02-23 NOTE — Progress Notes (Signed)
CC: [name] P Gill is a 28 y.o. male is here for chest congesion x 1 week   Subjective: HPI:  Complaint of cough and wheezing that has been present for approximately 2 weeks now. He's been using a variety of over-the-counter cough and cold medications without much benefit. Symptoms are present all hours of the day, nothing seems to make better or worse. Cough moderate in severity wheezing mild. Denies shortness of breath, chest pain, fevers, chills, blood in sputum. Denies nasal congestion, facial pressure nor ear pain.  Complains of a sore on the right side of the penis that came on Saturday and has been resolving over the past 2 days without any particular intervention. It was red, flat, painful to the touch and he's never had this before. It has been accompanied by dysuria that comes and goes throughout the day but has been persistent since at least Saturday. Denies blood in urine, hesitancy or frequency.   Review Of Systems Outlined In HPI  Past Medical History  Diagnosis Date  . GERD (gastroesophageal reflux disease)   . Anxiety   . Depression   . Stutter     No past surgical history on file. No family history on file.  History   Social History  . Marital Status: Single    Spouse Name: N/A    Number of Children: N/A  . Years of Education: N/A   Occupational History  . Not on file.   Social History Main Topics  . Smoking status: Never Smoker   . Smokeless tobacco: Not on file  . Alcohol Use: No  . Drug Use: No  . Sexual Activity: No   Other Topics Concern  . Not on file   Social History Narrative  . No narrative on file     Objective: BP 125/75  Pulse 88  Temp(Src) 98.6 F (37 C) (Oral)  Wt 159 lb (72.122 kg)  SpO2 100%  General: Alert and Oriented, No Acute Distress HEENT: Pupils equal, round, reactive to light. Conjunctivae clear.  External ears unremarkable, canals clear with intact TMs with appropriate landmarks.  Middle ear appears open without  effusion. Pink inferior turbinates.  Moist mucous membranes, pharynx without inflammation nor lesions.  Neck supple without palpable lymphadenopathy nor abnormal masses. Lungs: Comfortable work of breathing with mild wheezing in the left lung lobes. No rhonchi rales nor signs of consolidation Cardiac: Regular rate and rhythm. Normal S1/S2.  No murmurs, rubs, nor gallops.   Genitourinary: Half centimeter diameter faintly erythematous ulceration slightly tender to the touch on the right middle shaft of the penis without skin abnormalities elsewhere on the genitals. No discharge from the meatus Mental Status: No depression, anxiety, nor agitation. Skin: Warm and dry.  Assessment & Plan: [name] was seen today for chest congesion x 1 week.  Diagnoses and associated orders for this visit:  Wheezing - predniSONE (DELTASONE) 20 MG tablet; Two tabs at once daily for five days. - azithromycin (ZITHROMAX) 250 MG tablet; Take two tabs at once on day 1, then one tab daily on days 2-5.  Genital lesion, male - Urinalysis, Routine w reflex microscopic - HSV(herpes smplx)abs-1+2(IgG+IgM)-bld    wheezing: Start moderate dose of prednisone since 60 mg caused irritability when taken for shingles earlier in the summer. Begin azithromycin as well. Genital lesion: Discussed that HSV 1/2 IgM and IgG both might not be positive at this time if his lesion was from herpes. Essentially he could have a false negative however false-positive would be rare. He  would prefer to have the blood test done at this time still knowing this information. Urinalysis for dysuria    Return if symptoms worsen or fail to improve.

## 2014-03-09 DIAGNOSIS — R300 Dysuria: Secondary | ICD-10-CM | POA: Diagnosis not present

## 2014-03-09 DIAGNOSIS — N342 Other urethritis: Secondary | ICD-10-CM | POA: Diagnosis not present

## 2014-03-09 DIAGNOSIS — N410 Acute prostatitis: Secondary | ICD-10-CM | POA: Diagnosis not present

## 2014-03-15 DIAGNOSIS — R300 Dysuria: Secondary | ICD-10-CM | POA: Diagnosis not present

## 2014-04-04 ENCOUNTER — Ambulatory Visit: Payer: Medicare Other | Admitting: Family Medicine

## 2014-09-20 ENCOUNTER — Ambulatory Visit: Payer: Self-pay | Admitting: Family Medicine

## 2014-10-10 ENCOUNTER — Encounter: Payer: Self-pay | Admitting: Family Medicine

## 2014-10-10 ENCOUNTER — Ambulatory Visit (INDEPENDENT_AMBULATORY_CARE_PROVIDER_SITE_OTHER): Payer: Medicare Other | Admitting: Family Medicine

## 2014-10-10 VITALS — BP 124/60 | HR 67 | Ht 67.75 in | Wt 170.0 lb

## 2014-10-10 DIAGNOSIS — L723 Sebaceous cyst: Secondary | ICD-10-CM | POA: Diagnosis not present

## 2014-10-10 DIAGNOSIS — Z0000 Encounter for general adult medical examination without abnormal findings: Secondary | ICD-10-CM | POA: Diagnosis not present

## 2014-10-10 NOTE — Progress Notes (Signed)
CC: Austin Gill is a 29 y.o. male is here for Annual Exam   Subjective: HPI:  No family history of colon cancer or prostate cancer, will consider screening at age 65.   Influenza Vaccine: no current indication Pneumovax: no current indication Td/Tdap: Tdap UTD Zoster: (Start 31 yo)  Requesting complete physical exam with his only complaint being a small white lump above the shaft of the penis is been present for the last 2 months. It's painless. It does not seem to be getting bigger or smaller.  Review of Systems - General ROS: negative for - chills, fever, night sweats, weight gain or weight loss Ophthalmic ROS: negative for - decreased vision Psychological ROS: negative for - anxiety or depression ENT ROS: negative for - hearing change, nasal congestion, tinnitus or allergies Hematological and Lymphatic ROS: negative for - bleeding problems, bruising or swollen lymph nodes Breast ROS: negative Respiratory ROS: no cough, shortness of breath, or wheezing Cardiovascular ROS: no chest pain or dyspnea on exertion Gastrointestinal ROS: no abdominal pain, change in bowel habits, or black or bloody stools Genito-Urinary ROS: negative for - genital discharge, genital ulcers, incontinence or abnormal bleeding from genitals Musculoskeletal ROS: negative for - joint pain or muscle pain Neurological ROS: negative for - headaches or memory loss Dermatological ROS: negative for lumps, mole changes, rash and skin lesion changes other than that described above  Past Medical History  Diagnosis Date  . GERD (gastroesophageal reflux disease)   . Anxiety   . Depression   . Stutter     No past surgical history on file. No family history on file.  History   Social History  . Marital Status: Single    Spouse Name: N/A  . Number of Children: N/A  . Years of Education: N/A   Occupational History  . Not on file.   Social History Main Topics  . Smoking status: Never Smoker   . Smokeless  tobacco: Not on file  . Alcohol Use: No  . Drug Use: No  . Sexual Activity: No   Other Topics Concern  . Not on file   Social History Narrative     Objective: BP 124/60 mmHg  Pulse 67  Ht 5' 7.75" (1.721 m)  Wt 170 lb (77.111 kg)  BMI 26.03 kg/m2  General: No Acute Distress HEENT: Atraumatic, normocephalic, conjunctivae normal without scleral icterus.  No nasal discharge, hearing grossly intact, TMs with good landmarks bilaterally with no middle ear abnormalities, posterior pharynx clear without oral lesions. Neck: Supple, trachea midline, no cervical nor supraclavicular adenopathy. Pulmonary: Clear to auscultation bilaterally without wheezing, rhonchi, nor rales. Cardiac: Regular rate and rhythm.  No murmurs, rubs, nor gallops. No peripheral edema.  2+ peripheral pulses bilaterally. Abdomen: Bowel sounds normal.  No masses.  Non-tender without rebound.  Negative Murphy's sign. NI:DPOEU without lesions.  Bilateral descended non-tender testicles.just above the shaft of the penis in the suprapubic space there is a cystic structure that is nontender approximately 3 mm diameter MSK: Grossly intact, no signs of weakness.  Full strength throughout upper and lower extremities.  Full ROM in upper and lower extremities.  No midline spinal tenderness. Neuro: Gait unremarkable, CN II-XII grossly intact.  C5-C6 Reflex 2/4 Bilaterally, L4 Reflex 2/4 Bilaterally.  Cerebellar function intact. Skin: No rashes. Psych: Alert and oriented to person/place/time.  Thought process normal. No anxiety/depression.  Assessment & Plan: Austin Gill was seen today for annual exam.  Diagnoses and all orders for this visit:  Sebaceous cyst Orders: -  Ambulatory referral to Dermatology  Annual physical exam Orders: -     Lipid panel -     Comp Met (CMET) -     CBC  Healthy lifestyle interventions including but not limited to regular exercise, a healthy low fat diet, moderation of salt intake, the dangers  of tobacco/alcohol/recreational drug use, nutrition supplementation, and accident avoidance were discussed with the patient and a handout was provided for future reference.  Offered referral to dermatology for removal of cystic structure in the pubic area which he would like to pursue.  Return if symptoms worsen or fail to improve.

## 2014-11-10 DIAGNOSIS — L720 Epidermal cyst: Secondary | ICD-10-CM | POA: Diagnosis not present

## 2014-11-17 ENCOUNTER — Encounter: Payer: Self-pay | Admitting: Family Medicine

## 2014-11-17 DIAGNOSIS — L989 Disorder of the skin and subcutaneous tissue, unspecified: Secondary | ICD-10-CM | POA: Insufficient documentation

## 2015-01-02 ENCOUNTER — Emergency Department (INDEPENDENT_AMBULATORY_CARE_PROVIDER_SITE_OTHER)
Admission: EM | Admit: 2015-01-02 | Discharge: 2015-01-02 | Disposition: A | Discharge status: Home / Self Care | Payer: Medicare Other | Source: Home / Self Care | Attending: Family Medicine | Admitting: Family Medicine

## 2015-01-02 ENCOUNTER — Encounter: Payer: Self-pay | Admitting: *Deleted

## 2015-01-02 DIAGNOSIS — N451 Epididymitis: Secondary | ICD-10-CM | POA: Diagnosis not present

## 2015-01-02 LAB — POCT URINALYSIS DIP (MANUAL ENTRY)
BILIRUBIN UA: NEGATIVE
BILIRUBIN UA: NEGATIVE
Blood, UA: NEGATIVE
GLUCOSE UA: NEGATIVE
Leukocytes, UA: NEGATIVE
Nitrite, UA: NEGATIVE
SPEC GRAV UA: 1.02 (ref 1.005–1.03)
Urobilinogen, UA: 0.2 (ref 0–1)
pH, UA: 7 (ref 5–8)

## 2015-01-02 MED ORDER — CEFTRIAXONE SODIUM 250 MG IJ SOLR
250.0000 mg | Freq: Once | INTRAMUSCULAR | Status: AC
  Administered 2015-01-02: 250 mg via INTRAMUSCULAR

## 2015-01-02 MED ORDER — DOXYCYCLINE HYCLATE 100 MG PO CAPS: 100.0000 mg | ORAL_CAPSULE | Freq: Two times a day (BID) | ORAL | Status: DC

## 2015-01-02 NOTE — ED Provider Notes (Signed)
CSN: [id number]     Arrival date & time 01/02/15  1531 History   First MD Initiated Contact with Patient 01/02/15 1545     Chief Complaint  Patient presents with  . Testicle Pain      HPI Comments: Patient complains of one week history of mild testicular pain.  No swelling, erythema, or warmth of scrotum.  No urethral discharge or dysuria. No genital rash.  During the past two days he has had chills and mild myalgias.  He reports that he has been lifting weights recently. He reports that he had intercourse about two weeks ago.  Patient is a 29 y.o. male presenting with testicular pain. The history is provided by the patient.  Testicle Pain This is a new problem. Episode onset: 1 week ago. The problem occurs constantly. The problem has not changed since onset.Associated symptoms include headaches. Pertinent negatives include no abdominal pain. Exacerbated by: movement. Nothing relieves the symptoms. He has tried nothing for the symptoms.    Past Medical History  Diagnosis Date  . GERD (gastroesophageal reflux disease)   . Anxiety   . Depression   . Stutter    History reviewed. No pertinent past surgical history. History reviewed. No pertinent family history. Social History  Substance Use Topics  . Smoking status: Never Smoker   . Smokeless tobacco: None  . Alcohol Use: No    Review of Systems  Constitutional: Positive for chills. Negative for fever, diaphoresis and fatigue.  Eyes: Negative.   Respiratory: Negative.   Cardiovascular: Negative.   Gastrointestinal: Negative for nausea and abdominal pain.  Genitourinary: Positive for testicular pain. Negative for dysuria, urgency, frequency, hematuria, flank pain, discharge, penile swelling, scrotal swelling, difficulty urinating, genital sores and penile pain.  Musculoskeletal: Positive for myalgias.  Skin: Negative.   Neurological: Positive for headaches.    Allergies  Buspar and Paxil  Home Medications   Prior to  Admission medications   Medication Sig Start Date End Date Taking? Authorizing Provider  doxycycline (VIBRAMYCIN) 100 MG capsule Take 1 capsule (100 mg total) by mouth 2 (two) times daily. 01/02/15   Lattie Haw, MD   Meds Ordered and Administered this Visit   Medications  cefTRIAXone (ROCEPHIN) injection 250 mg (250 mg Intramuscular Given 01/02/15 1640)    BP 129/76 mmHg  Pulse 81  Temp(Src) 100 F (37.8 C) (Oral)  Resp 16  Ht 5\' 7"  (1.702 m)  Wt 160 lb (72.576 kg)  BMI 25.05 kg/m2  SpO2 97% No data found.   Physical Exam Nursing notes and Vital Signs reviewed. Appearance:  Patient appears stated age, and in no acute distress Eyes:  Pupils are equal, round, and reactive to light and accomodation.  Extraocular movement is intact.  Conjunctivae are not inflamed   Pharynx:  Normal Neck:  Supple.  No adenopathy Lungs:  Clear to auscultation.  Breath sounds are equal.  Moving air well. Heart:  Regular rate and rhythm without murmurs, rubs, or gallops.  Abdomen:  Nontender without masses or hepatosplenomegaly.  Bowel sounds are present.  No CVA or flank tenderness.  Extremities:  No edema.   Skin:  No rash present.  Genitourinary:  Penis normal without lesions or urethral discharge.  Scrotum is normal without erythema or warmth.  Testes are descended bilaterally without nodules.  There is bilateral epididymus tenderness, more pronounced on the right.  No hernias are palpated.  Inguinal nodes are mildly tender to palpation bilaterally.  Cremasteric reflex is present bilaterally. ED Course  Procedures none    Labs Reviewed  POCT URINALYSIS DIP (MANUAL ENTRY) - Abnormal; Notable for the following:    Protein Ur, POC trace (*)    All other components within normal limits  GC/CHLAMYDIA PROBE AMP, URINE  POCT CBC W AUTO DIFF ([facility]):  WBC 6.0; LY 33.4; MO 10.0;  GR 56.6; Hgb 15.0; Platelets 244              MDM   1. Epididymitis    GC/chlamydia  pending. Rocephin  IM; begin doxycycline  BID for one week. May continue Ibuprofen , 4 tabs every 8 hours with food.   If symptoms become significantly worse during the night or over the weekend, proceed to the local emergency room.  Followup with Family Doctor if not improved in one week.     Lattie Haw, MD 01/02/15 [phone]

## 2015-01-02 NOTE — ED Notes (Signed)
Pt c/o bilateral testicular pain x 1wk. Denies dysuria.

## 2015-01-02 NOTE — Discharge Instructions (Signed)
May continue Ibuprofen , 4 tabs every 8 hours with food.   If symptoms become significantly worse during the night or over the weekend, proceed to the local emergency room.    Epididymitis Epididymitis is a swelling (inflammation) of the epididymis. The epididymis is a cord-like structure along the back part of the testicle. Epididymitis is usually, but not always, caused by infection. This is usually a sudden problem beginning with chills, fever and pain behind the scrotum and in the testicle. There may be swelling and redness of the testicle. DIAGNOSIS  Physical examination will reveal a tender, swollen epididymis. Sometimes, cultures are obtained from the urine or from prostate secretions to help find out if there is an infection or if the cause is a different problem. Sometimes, blood work is performed to see if your white blood cell count is elevated and if a germ (bacterial) or viral infection is present. Using this knowledge, an appropriate medicine which kills germs (antibiotic) can be chosen by your caregiver. A viral infection causing epididymitis will most often go away (resolve) without treatment. HOME CARE INSTRUCTIONS   Hot sitz baths for 20 minutes, 4 times per day, may help relieve pain.  Only take over-the-counter or prescription medicines for pain, discomfort or fever as directed by your caregiver.  Take all medicines, including antibiotics, as directed. Take the antibiotics for the full prescribed length of time even if you are feeling better.  It is very important to keep all follow-up appointments. SEEK IMMEDIATE MEDICAL CARE IF:   You have a fever.  You have pain not relieved with medicines.  You have any worsening of your problems.  Your pain seems to come and go.  You develop pain, redness, and swelling in the scrotum and surrounding areas. MAKE SURE YOU:   Understand these instructions.  Will watch your condition.  Will get help right away if you are  not doing well or get worse. Document Released: 04/19/2000 Document Revised: 07/15/2011 Document Reviewed: 03/09/2009 [facility] Patient Information 2015 [place], [place]. This information is not intended to replace advice given to you by your health care provider. Make sure you discuss any questions you have with your health care provider.

## 2015-01-03 LAB — POCT CBC W AUTO DIFF (K'VILLE URGENT CARE)

## 2015-01-03 LAB — GC/CHLAMYDIA PROBE AMP, URINE
Chlamydia, Swab/Urine, PCR: NEGATIVE
GC Probe Amp, Urine: NEGATIVE

## 2015-01-04 ENCOUNTER — Telehealth: Payer: Self-pay | Admitting: *Deleted

## 2015-01-04 ENCOUNTER — Ambulatory Visit: Payer: Self-pay | Admitting: Family Medicine

## 2015-01-10 ENCOUNTER — Ambulatory Visit: Payer: Self-pay | Admitting: Family Medicine

## 2015-05-29 ENCOUNTER — Ambulatory Visit (INDEPENDENT_AMBULATORY_CARE_PROVIDER_SITE_OTHER): Payer: Medicare Other | Admitting: Family Medicine

## 2015-05-29 ENCOUNTER — Encounter: Payer: Self-pay | Admitting: Family Medicine

## 2015-05-29 DIAGNOSIS — Z0289 Encounter for other administrative examinations: Secondary | ICD-10-CM

## 2015-05-29 NOTE — Progress Notes (Signed)
No show 05/29/2015

## 2015-08-17 ENCOUNTER — Encounter: Payer: Self-pay | Admitting: Family Medicine

## 2015-08-17 ENCOUNTER — Ambulatory Visit (INDEPENDENT_AMBULATORY_CARE_PROVIDER_SITE_OTHER): Payer: Medicare Other | Admitting: Family Medicine

## 2015-08-17 VITALS — BP 128/87 | HR 82 | Temp 98.3°F | Wt 169.0 lb

## 2015-08-17 DIAGNOSIS — Z7251 High risk heterosexual behavior: Secondary | ICD-10-CM | POA: Diagnosis not present

## 2015-08-17 DIAGNOSIS — Z202 Contact with and (suspected) exposure to infections with a predominantly sexual mode of transmission: Secondary | ICD-10-CM

## 2015-08-17 DIAGNOSIS — R197 Diarrhea, unspecified: Secondary | ICD-10-CM

## 2015-08-17 MED ORDER — DIPHENOXYLATE-ATROPINE 2.5-0.025 MG PO TABS: 1.0000 | ORAL_TABLET | Freq: Four times a day (QID) | ORAL | Status: DC | PRN

## 2015-08-17 NOTE — Progress Notes (Signed)
CC: Austin PurpuraDustin P Gill is a 30 y.o. male is here for Diarrhea and Headache   Subjective: HPI:  For the past 7 days he's had diarrhea described as watery and loose stool that occurs 3-4 times a day always after he eats a meal. It comes on urgently and he has to race to the bathroom. He denies any blood or melena appearance to his stool and his been treating this with Pepto-Bismol but doesn't seem to be helping much. He denies any known exposure to others with similar symptoms. He denies any nausea currently but did have some when this first started. He had a headache and fever first started however this was only present for 1 day. He denies any abdominal pain. Currently he is not having any fever, chills, night sweats or genitourinary complaints. He Recently had unprotected sex with an individual that may have had HIV and he would like to be checked for HIV.  Review Of Systems Outlined In HPI  Past Medical History  Diagnosis Date  . GERD (gastroesophageal reflux disease)   . Anxiety   . Depression   . Stutter     No past surgical history on file. No family history on file.  Social History   Social History  . Marital Status: Single    Spouse Name: N/A  . Number of Children: N/A  . Years of Education: N/A   Occupational History  . Not on file.   Social History Main Topics  . Smoking status: Never Smoker   . Smokeless tobacco: Not on file  . Alcohol Use: No  . Drug Use: No  . Sexual Activity: No   Other Topics Concern  . Not on file   Social History Narrative     Objective: BP 128/87 mmHg  Pulse 82  Temp(Src) 98.3 F (36.8 C) (Oral)  Wt 169 lb (76.658 kg)  Vital signs reviewed. General: Alert and Oriented, No Acute Distress HEENT: Pupils equal, round, reactive to light. Conjunctivae clear.  External ears unremarkable.  Moist mucous membranes. Lungs: Clear and comfortable work of breathing, speaking in full sentences without accessory muscle use. Cardiac: Regular rate and  rhythm.  Neuro: CN II-XII grossly intact, gait normal. Extremities: No peripheral edema.  Strong peripheral pulses.  Mental Status: No depression, anxiety, nor agitation. Logical though process. Skin: Warm and dry.  Assessment & Plan: Austin Gill was seen today for diarrhea and headache.  Diagnoses and all orders for this visit:  Diarrhea, unspecified type -     diphenoxylate-atropine (LOMOTIL) 2.5-0.025 MG tablet; Take 1 tablet by mouth 4 (four) times daily as needed for diarrhea or loose stools.  Unprotected sex -     HIV antibody  STD exposure -     HIV antibody   Requested that he provide us with a stool sample so we can rule out C. difficile. He is tells me he will be unable to do this until tomorrow however our lab will be closed and will not be open. We'll try Lomotil and if no better over the weekend call on Monday to get a stool sample lab slip.   Return if symptoms worsen or fail to improve.

## 2015-10-09 ENCOUNTER — Encounter: Payer: Self-pay | Admitting: Family Medicine

## 2015-10-09 ENCOUNTER — Ambulatory Visit (INDEPENDENT_AMBULATORY_CARE_PROVIDER_SITE_OTHER): Payer: Medicare Other | Admitting: Family Medicine

## 2015-10-09 DIAGNOSIS — Z0289 Encounter for other administrative examinations: Secondary | ICD-10-CM

## 2015-10-09 NOTE — Progress Notes (Signed)
No show 10/09/2015

## 2015-11-01 ENCOUNTER — Encounter: Payer: Self-pay | Admitting: Emergency Medicine

## 2015-11-01 ENCOUNTER — Emergency Department (INDEPENDENT_AMBULATORY_CARE_PROVIDER_SITE_OTHER)
Admission: EM | Admit: 2015-11-01 | Discharge: 2015-11-01 | Disposition: A | Discharge status: Home / Self Care | Payer: Medicare Other | Source: Home / Self Care | Attending: Family Medicine | Admitting: Family Medicine

## 2015-11-01 DIAGNOSIS — H109 Unspecified conjunctivitis: Secondary | ICD-10-CM | POA: Diagnosis not present

## 2015-11-01 DIAGNOSIS — R300 Dysuria: Secondary | ICD-10-CM

## 2015-11-01 LAB — POCT URINALYSIS DIP (MANUAL ENTRY)
Blood, UA: NEGATIVE
Glucose, UA: NEGATIVE
Leukocytes, UA: NEGATIVE
Nitrite, UA: NEGATIVE
Protein Ur, POC: 30 — AB
Spec Grav, UA: 1.025 (ref 1.005–1.03)
Urobilinogen, UA: 0.2 (ref 0–1)
pH, UA: 7 (ref 5–8)

## 2015-11-01 MED ORDER — CIPROFLOXACIN HCL 500 MG PO TABS: 500.0000 mg | ORAL_TABLET | Freq: Two times a day (BID) | ORAL | Status: DC

## 2015-11-01 MED ORDER — KETOROLAC TROMETHAMINE 0.5 % OP SOLN: 1.0000 [drp] | Freq: Four times a day (QID) | OPHTHALMIC | Status: DC

## 2015-11-01 NOTE — ED Notes (Signed)
Left eye red, swollen, painful, rash on face x 2 days Dysuria x 2 days, denies new sexual partners or unprotected sex

## 2015-11-01 NOTE — ED Provider Notes (Signed)
CSN: [id number]     Arrival date & time 11/01/15  1056 History   First MD Initiated Contact with Patient 11/01/15 1250     Chief Complaint  Patient presents with  . Eye Problem      HPI Comments: Two days ago patient felt a brief foreign body sensation in his left eye, now resolved, followed by redness and itching of his left eye.  He has had some mucous discharge in the left eye each morning.  He also has had itching and redness lateral to his left eye.  The area is not painful however. He also complains of dysuria for two days without frequency, hesitancy, urgency, hematuria, or urethral discharge.    The history is provided by the patient.    Past Medical History  Diagnosis Date  . GERD (gastroesophageal reflux disease)   . Anxiety   . Depression   . Stutter    History reviewed. No pertinent past surgical history. History reviewed. No pertinent family history. Social History  Substance Use Topics  . Smoking status: Never Smoker   . Smokeless tobacco: None  . Alcohol Use: No    Review of Systems No sore throat No cough No pleuritic pain No wheezing No nasal congestion No post-nasal drainage No sinus pain/pressure + itchy/red eyes No earache No hemoptysis No SOB No fever/chills No nausea No vomiting No abdominal pain No diarrhea + dysuria; no urethral discharge ? skin rash face No fatigue No myalgias No headache    Allergies  Buspar and Paxil  Home Medications   Prior to Admission medications   Medication Sig Start Date End Date Taking? Authorizing Provider  ciprofloxacin (CIPRO) 500 MG tablet Take 1 tablet (500 mg total) by mouth 2 (two) times daily. 11/01/15   Lattie HawStephen A Caryl Fate, MD  ketorolac (ACULAR) 0.5 % ophthalmic solution Place 1 drop into the left eye 4 (four) times daily. 11/01/15   Lattie HawStephen A Audrey Eller, MD   Meds Ordered and Administered this Visit  Medications - No data to display  BP 116/75 mmHg  Pulse 68  Temp(Src) 98 F (36.7 C) (Oral)  Ht 5'  7" (1.702 m)  Wt 181 lb (82.101 kg)  BMI 28.34 kg/m2  SpO2 99% No data found.   Physical Exam Nursing notes and Vital Signs reviewed. Appearance:  Patient appears stated age, and in no acute distress Eyes:  Pupils are equal, round, and reactive to light and accomodation.  Extraocular movement is intact.  Right conjunctivae normal.  Left conjunctivae minimally injected.  No photophobia.  No left lid swelling, erythema or tenderness to palpation.   Ears:  Canals normal.  Tympanic membranes normal.  Nose:   Normal turbinates.  No sinus tenderness.   Pharynx:  Normal Neck:  Supple.  Slightly tender enlarged posterior/lateral nodes are palpated bilaterally  Lungs:  Clear to auscultation.  Breath sounds are equal.  Moving air well. Heart:  Regular rate and rhythm without murmurs, rubs, or gallops.  Abdomen:  Nontender without masses or hepatosplenomegaly.  Bowel sounds are present.  No CVA or flank tenderness.  Extremities:  No edema.  Skin:  No rash present (no rash noted on face)   ED Course  Procedures none    Labs Reviewed  POCT URINALYSIS DIP (MANUAL ENTRY) - Abnormal; Notable for the following:    Bilirubin, UA small (*)    Ketones, POC UA moderate (40) (*)    Protein Ur, POC =30 (*)    All other components within normal limits  Visual Acuity Review  Right Eye Distance: 20/20 Left Eye Distance: 20/20 Bilateral Distance: 20/20 (without correction)    MDM   1. Conjunctivitis of left eye; suspect viral   2. Dysuria    Urine culture pending.  Begin empiric Cipro 500mg  BID Rx for Acular ophth. Solution. Followup with ophthalmologist if eye not improved 5 days.    Lattie HawStephen A Yasmine Kilbourne, MD 11/01/15 [phone]

## 2015-11-01 NOTE — Discharge Instructions (Signed)
Increase fluid intake. ° ° ° °Dysuria °Dysuria is pain or discomfort while urinating. The pain or discomfort may be felt in the tube that carries urine out of the bladder (urethra) or in the surrounding tissue of the genitals. The pain may also be felt in the groin area, lower abdomen, and lower back. You may have to urinate frequently or have the sudden feeling that you have to urinate (urgency). Dysuria can affect both men and women, but is more common in women. °Dysuria can be caused by many different things, including: °· Urinary tract infection in women. °· Infection of the kidney or bladder. °· Kidney stones or bladder stones. °· Certain sexually transmitted infections (STIs), such as chlamydia. °· Dehydration. °· Inflammation of the vagina. °· Use of certain medicines. °· Use of certain soaps or scented products that cause irritation. °HOME CARE INSTRUCTIONS °Watch your dysuria for any changes. The following actions may help to reduce any discomfort you are feeling: °· Drink enough fluid to keep your urine clear or pale yellow. °· Empty your bladder often. Avoid holding urine for long periods of time. °· After a bowel movement or urination, women should cleanse from front to back, using each tissue only once. °· Empty your bladder after sexual intercourse. °· Take medicines only as directed by your health care provider. °· If you were prescribed an antibiotic medicine, finish it all even if you start to feel better. °· Avoid caffeine, tea, and alcohol. They can irritate the bladder and make dysuria worse. In men, alcohol may irritate the prostate. °· Keep all follow-up visits as directed by your health care provider. This is important. °· If you had any tests done to find the cause of dysuria, it is your responsibility to obtain your test results. Ask the lab or department performing the test when and how you will get your results. Talk with your health care provider if you have any questions about your  results. °SEEK MEDICAL CARE IF: °· You develop pain in your back or sides. °· You have a fever. °· You have nausea or vomiting. °· You have blood in your urine. °· You are not urinating as often as you usually do. °SEEK IMMEDIATE MEDICAL CARE IF: °· You pain is severe and not relieved with medicines. °· You are unable to hold down any fluids. °· You or someone else notices a change in your mental function. °· You have a rapid heartbeat at rest. °· You have shaking or chills. °· You feel extremely weak. °  °This information is not intended to replace advice given to you by your health care provider. Make sure you discuss any questions you have with your health care provider. °  °Document Released: 01/19/2004 Document Revised: 05/13/2014 Document Reviewed: 12/16/2013 °Elsevier Interactive Patient Education ©2016 Elsevier Inc. ° ° °

## 2015-11-02 ENCOUNTER — Telehealth: Payer: Self-pay | Admitting: Emergency Medicine

## 2015-11-02 ENCOUNTER — Telehealth: Payer: Self-pay | Admitting: *Deleted

## 2015-11-02 MED ORDER — VALACYCLOVIR HCL 1 G PO TABS: 1000.0000 mg | ORAL_TABLET | Freq: Three times a day (TID) | ORAL | Status: DC

## 2015-11-02 MED ORDER — TOBRAMYCIN 0.3 % OP SOLN: 1.0000 [drp] | OPHTHALMIC | Status: DC

## 2015-11-02 NOTE — ED Notes (Signed)
Left message , checking to see if his eye is better and if he has developed a rash on his face. Call back if rash appears

## 2015-11-02 NOTE — Telephone Encounter (Signed)
Patient had been concerned about possible shingles.  Will begin empiric Valtrex 1gm po TID (#21, no refill). Will add empiric Tobrex ophthalmic supension 0.3%, one drop q4hr to left eye, #515mL, no refill

## 2015-11-03 ENCOUNTER — Telehealth: Payer: Self-pay | Admitting: Emergency Medicine

## 2015-11-03 DIAGNOSIS — H1032 Unspecified acute conjunctivitis, left eye: Secondary | ICD-10-CM | POA: Diagnosis not present

## 2015-11-03 NOTE — ED Notes (Addendum)
Eye is red, itching, worse, no rash on his face. [facility]t 1:20 today, called pt back he acknowledges understanding.

## 2015-11-05 ENCOUNTER — Encounter: Payer: Self-pay | Admitting: Emergency Medicine

## 2015-11-05 ENCOUNTER — Emergency Department (INDEPENDENT_AMBULATORY_CARE_PROVIDER_SITE_OTHER)
Admission: EM | Admit: 2015-11-05 | Discharge: 2015-11-05 | Disposition: A | Discharge status: Home / Self Care | Payer: Medicare Other | Source: Home / Self Care | Attending: Family Medicine | Admitting: Family Medicine

## 2015-11-05 ENCOUNTER — Telehealth: Payer: Self-pay | Admitting: Emergency Medicine

## 2015-11-05 ENCOUNTER — Other Ambulatory Visit: Payer: Self-pay | Admitting: Emergency Medicine

## 2015-11-05 DIAGNOSIS — N342 Other urethritis: Secondary | ICD-10-CM

## 2015-11-05 LAB — POCT URINALYSIS DIP (MANUAL ENTRY)
Bilirubin, UA: NEGATIVE
Blood, UA: NEGATIVE
Glucose, UA: NEGATIVE
Ketones, POC UA: NEGATIVE
Leukocytes, UA: NEGATIVE
Nitrite, UA: NEGATIVE
Protein Ur, POC: NEGATIVE
Spec Grav, UA: 1.01 (ref 1.005–1.03)
Urobilinogen, UA: 0.2 (ref 0–1)
pH, UA: 6.5 (ref 5–8)

## 2015-11-05 MED ORDER — CEFTRIAXONE SODIUM 1 G IJ SOLR
1.0000 g | Freq: Once | INTRAMUSCULAR | Status: AC
  Administered 2015-11-05: 1 g via INTRAMUSCULAR

## 2015-11-05 MED ORDER — DOXYCYCLINE HYCLATE 100 MG PO CAPS: 100.0000 mg | ORAL_CAPSULE | Freq: Two times a day (BID) | ORAL | Status: DC

## 2015-11-05 NOTE — ED Notes (Signed)
Patients password to get lab results is  [employer] 123

## 2015-11-05 NOTE — ED Notes (Signed)
Patient presents with dusuria times 6 [facility], currently taking Cipro and Valtrex

## 2015-11-05 NOTE — ED Notes (Signed)
Spoke with patient via telephone, he was seen on June 28th, for dysuria and hematuria and he continues to have the same issues, even after taking the prescription. He was encouraged to return here or follow up with PCP.

## 2015-11-05 NOTE — Discharge Instructions (Signed)
Urethritis, Adult °Urethritis is an inflammation of the tube through which urine exits your bladder (urethra).  °CAUSES °Urethritis is often caused by an infection in your urethra. The infection can be viral, like herpes. The infection can also be bacterial, like gonorrhea. °RISK FACTORS °Risk factors of urethritis include: °· Having sex without using a condom. °· Having multiple sexual partners. °· Having poor hygiene. °SIGNS AND SYMPTOMS °Symptoms of urethritis are less noticeable in women than in men. These symptoms include: °· Burning feeling when you urinate (dysuria). °· Discharge from your urethra. °· Blood in your urine (hematuria). °· Urinating more than usual. °DIAGNOSIS  °To confirm a diagnosis of urethritis, your health care provider will do the following: °· Ask about your sexual history. °· Perform a physical exam. °· Have you provide a sample of your urine for lab testing. °· Use a cotton swab to gently collect a sample from your urethra for lab testing. °TREATMENT  °It is important to treat urethritis. Depending on the cause, untreated urethritis may lead to serious genital infections and possibly infertility. Urethritis caused by a bacterial infection is treated with antibiotic medicine. All sexual partners must be treated.  °HOME CARE INSTRUCTIONS °· Do not have sex until the test results are known and treatment is completed, even if your symptoms go away before you finish treatment. °· If you were prescribed an antibiotic, finish it all even if you start to feel better. °SEEK MEDICAL CARE IF:  °· Your symptoms are not improved in 3 days. °· Your symptoms are getting worse. °· You develop abdominal pain or pelvic pain (in women). °· You develop joint pain. °· You have a fever. °SEEK IMMEDIATE MEDICAL CARE IF:  °· You have severe pain in the belly, back, or side. °· You have repeated vomiting. °MAKE SURE YOU: °· Understand these instructions. °· Will watch your condition. °· Will get help right away  if you are not doing well or get worse. °  °This information is not intended to replace advice given to you by your health care provider. Make sure you discuss any questions you have with your health care provider. °  °Document Released: 10/16/2000 Document Revised: 09/06/2014 Document Reviewed: 12/21/2012 °Elsevier Interactive Patient Education ©2016 Elsevier Inc. ° °

## 2015-11-05 NOTE — ED Provider Notes (Signed)
CSN: [id number]     Arrival date & time 11/05/15  1426 History   None    Chief Complaint  Patient presents with  . Dysuria   (Consider location/radiation/quality/duration/timing/severity/associated sxs/prior Treatment) HPI Austin PurpuraDustin P Gill is a 30 y.o. male presenting to UC with c/o continued dysuria despite bring on ciprofloxacin since 11/01/15 after [facility].  He reports dysuria with hematuria.  Reports some concern for STDs but states he only wants urine tested due to financial concerned about blood tests.  He also notes he is somewhat concerned for herpes but denies prior dx of HSV and has not been advised by a partner he has been exposed. Denies rashes, lesions, redness or swelling of penis or scrotum.  Denies fever, chills, n/v/d.    Past Medical History  Diagnosis Date  . GERD (gastroesophageal reflux disease)   . Anxiety   . Depression   . Stutter    History reviewed. No pertinent past surgical history. History reviewed. No pertinent family history. Social History  Substance Use Topics  . Smoking status: Never Smoker   . Smokeless tobacco: None  . Alcohol Use: No    Review of Systems  Constitutional: Negative for fever and chills.  Gastrointestinal: Negative for nausea, vomiting, abdominal pain and diarrhea.  Genitourinary: Positive for dysuria, urgency, frequency and hematuria. Negative for flank pain, decreased urine volume, discharge, penile swelling, scrotal swelling, genital sores, penile pain and testicular pain.  Musculoskeletal: Negative for myalgias and back pain.    Allergies  Buspar and Paxil  Home Medications   Prior to Admission medications   Medication Sig Start Date End Date Taking? Authorizing Provider  doxycycline (VIBRAMYCIN) 100 MG capsule Take 1 capsule (100 mg total) by mouth 2 (two) times daily. One po bid x 7 days 11/05/15   Junius FinnerErin O'Malley, PA-C  ketorolac (ACULAR) 0.5 % ophthalmic solution Place 1 drop into the left eye  4 (four) times daily. 11/01/15   Lattie HawStephen A Beese, MD  tobramycin (TOBREX) 0.3 % ophthalmic solution Place 1 drop into the left eye every 4 (four) hours. 11/02/15   Lattie HawStephen A Beese, MD  valACYclovir (VALTREX) 1000 MG tablet Take 1 tablet (1,000 mg total) by mouth 3 (three) times daily. 11/02/15   Lattie HawStephen A Beese, MD   Meds Ordered and Administered this Visit   Medications  cefTRIAXone (ROCEPHIN) injection 1 g (1 g Intramuscular Given 11/05/15 1504)    BP 116/78 mmHg  Pulse 73  Temp(Src) 98.1 F (36.7 C) (Oral)  Resp 16  Ht 5\' 7"  (1.702 m)  Wt 175 lb (79.379 kg)  BMI 27.40 kg/m2  SpO2 99% No data found.   Physical Exam  Constitutional: He is oriented to person, place, and time. He appears well-developed and well-nourished.  HENT:  Head: Normocephalic and atraumatic.  Mouth/Throat: Oropharynx is clear and moist.  Eyes: EOM are normal.  Neck: Normal range of motion.  Cardiovascular: Normal rate, regular rhythm and normal heart sounds.   Pulmonary/Chest: Effort normal and breath sounds normal. No respiratory distress. He has no wheezes. He has no rales.  Abdominal: Soft. He exhibits no distension. There is no tenderness. There is no rebound, no guarding and no CVA tenderness.  Musculoskeletal: Normal range of motion.  Neurological: He is alert and oriented to person, place, and time.  Skin: Skin is warm and dry.  Psychiatric: He has a normal mood and affect. His behavior is normal.  Nursing note and vitals reviewed.   ED Course  Procedures (including critical care time)  Labs Review Labs Reviewed  GC/CHLAMYDIA PROBE AMP, URINE  POCT URINALYSIS DIP (MANUAL ENTRY)    Imaging Review No results found.    MDM   1. Urethritis    Pt c/o continued dysuria. Pt notes concern for STDs but declined blood tests for HIV, syphilis and HSV as he is concerned about cost.   Will send urine for GC/chlamyida and treat empirically  Tx in UC: Rocephin IM 1g  Rx: doxycycline 100mg  BID  for 7 days, discontinue use of cipro.  F/u with PCP in 1 week if not improving. Patient verbalized understanding and agreement with treatment plan.     Junius Finnerrin O'Malley, PA-C 11/05/15 1558

## 2015-11-05 NOTE — ED Notes (Signed)
No adverse reaction noted to injection 

## 2015-11-08 ENCOUNTER — Encounter: Payer: Self-pay | Admitting: Family Medicine

## 2015-11-08 ENCOUNTER — Ambulatory Visit (INDEPENDENT_AMBULATORY_CARE_PROVIDER_SITE_OTHER): Payer: Medicare Other | Admitting: Family Medicine

## 2015-11-08 DIAGNOSIS — Z0289 Encounter for other administrative examinations: Secondary | ICD-10-CM

## 2015-11-08 NOTE — Progress Notes (Signed)
No show 11/08/2015  

## 2015-11-09 LAB — GC/CHLAMYDIA PROBE AMP
CT Probe RNA: NOT DETECTED
GC Probe RNA: NOT DETECTED

## 2015-11-10 ENCOUNTER — Telehealth: Payer: Self-pay | Admitting: *Deleted

## 2015-11-10 NOTE — ED Notes (Signed)
Callback: No answer, [facility]

## 2016-01-24 DIAGNOSIS — N50819 Testicular pain, unspecified: Secondary | ICD-10-CM | POA: Diagnosis not present

## 2016-01-24 DIAGNOSIS — N4889 Other specified disorders of penis: Secondary | ICD-10-CM | POA: Diagnosis not present

## 2016-01-24 DIAGNOSIS — N451 Epididymitis: Secondary | ICD-10-CM | POA: Diagnosis not present

## 2016-05-30 ENCOUNTER — Ambulatory Visit (INDEPENDENT_AMBULATORY_CARE_PROVIDER_SITE_OTHER): Payer: Medicare Other | Admitting: Physician Assistant

## 2016-05-30 ENCOUNTER — Other Ambulatory Visit: Payer: Self-pay

## 2016-05-30 ENCOUNTER — Encounter: Payer: Self-pay | Admitting: Physician Assistant

## 2016-05-30 VITALS — BP 127/86 | HR 84 | Wt 182.0 lb

## 2016-05-30 DIAGNOSIS — Z0000 Encounter for general adult medical examination without abnormal findings: Secondary | ICD-10-CM

## 2016-05-30 DIAGNOSIS — E663 Overweight: Secondary | ICD-10-CM | POA: Diagnosis not present

## 2016-05-30 DIAGNOSIS — N5089 Other specified disorders of the male genital organs: Secondary | ICD-10-CM

## 2016-05-30 DIAGNOSIS — N50812 Left testicular pain: Secondary | ICD-10-CM | POA: Insufficient documentation

## 2016-05-30 NOTE — Patient Instructions (Addendum)
Go downstairs to schedule your scrotal ultrasound   Spermatocele A spermatocele is a fluid-filled sac (cyst) inside the scrotum. This type of cyst often forms at the top of the testicle where sperm is stored (epididymis). The cyst sometimes forms along the tube that carries sperm away from the epididymis (vas deferens). Spermatoceles are usually painless. Most cysts are small, but they can grow larger. Spermatoceles are not cancerous (are benign). What are the causes? The cause of this condition is not known. What are the signs or symptoms? In most cases, small cysts do not cause symptoms. If you do have symptoms, they may include:  Dull pain.  A feeling of heaviness.  An enlargement of your scrotum, if the cyst is large. How is this diagnosed? This condition is diagnosed based on a physical exam. You or your health care provider may notice the cyst when feeling your scrotum. Your provider may shine a light through (transilluminate) your scrotum to see if light will pass through the cyst. You may also have an ultrasound of your scrotum to rule out a tumor. How is this treated? Small spermatoceles do not need to be treated. If the spermatocele has grown large or is uncomfortable, surgery to remove the cyst may be recommended. Follow these instructions at home:  Watch your spermatocele for any changes.  Keep all follow-up visits as told by your health care provider. This is important. Contact a health care provider if:  Your spermatocele gets larger.  You have pain in your scrotum.  Your spermatocele comes back after treatment. This information is not intended to replace advice given to you by your health care provider. Make sure you discuss any questions you have with your health care provider. Document Released: 08/14/2015 Document Revised: 12/18/2015 Document Reviewed: 05/07/2015 Elsevier Interactive Patient Education  2017 [place] 18-39 Years,  Male Preventive care refers to lifestyle choices and visits with your health care provider that can promote health and wellness. What does preventive care include?  A yearly physical exam. This is also called an annual well check.  Dental exams once or twice a year.  Routine eye exams. Ask your health care provider how often you should have your eyes checked.  Personal lifestyle choices, including:  Daily care of your teeth and gums.  Regular physical activity.  Eating a healthy diet.  Avoiding tobacco and drug use.  Limiting alcohol use.  Practicing safe sex. What happens during an annual well check? The services and screenings done by your health care provider during your annual well check will depend on your age, overall health, lifestyle risk factors, and family history of disease. Counseling  Your health care provider may ask you questions about your:  Alcohol use.  Tobacco use.  Drug use.  Emotional well-being.  Home and relationship well-being.  Sexual activity.  Eating habits.  Work and work Statistician. Screening  You may have the following tests or measurements:  Height, weight, and BMI.  Blood pressure.  Lipid and cholesterol levels. These may be checked every 5 years starting at age 69.  Diabetes screening. This is done by checking your blood sugar (glucose) after you have not eaten for a while (fasting).  Skin check.  Hepatitis C blood test.  Hepatitis B blood test.  Sexually transmitted disease (STD) testing. Discuss your test results, treatment options, and if necessary, the need for more tests with your health care provider. Vaccines  Your health care provider may recommend certain vaccines, such  as:  Influenza vaccine. This is recommended every year.  Tetanus, diphtheria, and acellular pertussis (Tdap, Td) vaccine. You may need a Td booster every 10 years.  Varicella vaccine. You may need this if you have not been  vaccinated.  HPV vaccine. If you are 38 or younger, you may need three doses over 6 months.  Measles, mumps, and rubella (MMR) vaccine. You may need at least one dose of MMR.You may also need a second dose.  Pneumococcal 13-valent conjugate (PCV13) vaccine. You may need this if you have certain conditions and have not been vaccinated.  Pneumococcal polysaccharide (PPSV23) vaccine. You may need one or two doses if you smoke cigarettes or if you have certain conditions.  Meningococcal vaccine. One dose is recommended if you are age 73-21 years and a first-year college student living in a residence hall, or if you have one of several medical conditions. You may also need additional booster doses.  Hepatitis A vaccine. You may need this if you have certain conditions or if you travel or work in places where you may be exposed to hepatitis A.  Hepatitis B vaccine. You may need this if you have certain conditions or if you travel or work in places where you may be exposed to hepatitis B.  Haemophilus influenzae type b (Hib) vaccine. You may need this if you have certain risk factors. Talk to your health care provider about which screenings and vaccines you need and how often you need them. This information is not intended to replace advice given to you by your health care provider. Make sure you discuss any questions you have with your health care provider. Document Released: 06/18/2001 Document Revised: 01/10/2016 Document Reviewed: 02/21/2015 Elsevier Interactive Patient Education  2017 [employer].

## 2016-05-30 NOTE — Progress Notes (Signed)
HPI:                                                                Austin Gill is a [age] y.o. male who [facility] today to establish care   Current Concerns include: left testicular pain Onset: Monday Location: left testicle radiating to the groin Duration: constantly Character: "firmer", aching, 5/10, last night 9/10 Aggravating factors: walking, laying on left side Relieving factors: Tylenol, laying on right side Treatments tried: Tylenol helped No trauma. Denies heavy lifting. Denies sexual activity. Denies urinary symptoms. Patient was seen for left testicular pain on 01/24/16 by [facility] and treated for epididymitis with Doxy. He states this pain is different from the symptoms he was having at that time. Family history significant for prostate cancer in paternal grandfather and paternal uncle, and BPH in father  Hx of anxiety: controlled. Patient not taking any medication. Managing with exercise. Patient has been seen by behavioral health in the past. Has not been to psychiatry in over a year.  Hx of GERD: patient has needed Protonix for this in the past. States he has not had any symptoms since losing weight.   Health Maintenance Health Maintenance  Topic Date Due  . INFLUENZA VACCINE  05/30/2017 (Originally 12/05/2015)  . TETANUS/TDAP  03/06/2024  . HIV Screening  Completed    Health Habits  Diet: good  Exercise: HIT cardio training  ETOH: 4 x per month  Tobacco: no  Drugs: no  Past Medical History:  Diagnosis Date  . Anxiety   . Depression   . GERD (gastroesophageal reflux disease)   . Stutter    No past surgical history on file. Social History  Substance Use Topics  . Smoking status: Never Smoker  . Smokeless tobacco: Not on file  . Alcohol use No   family history includes Benign prostatic hyperplasia in his father; Prostate cancer in his paternal grandfather and paternal uncle.  Review  of Systems  Constitutional: Negative for chills, fever, malaise/fatigue and weight loss.  HENT: Negative.   Eyes: Negative.   Respiratory: Negative for cough and shortness of breath.   Cardiovascular: Negative for chest pain, palpitations and leg swelling.  Gastrointestinal: Negative for blood in stool and heartburn.  Genitourinary: Negative.   Musculoskeletal: Negative.   Skin: Negative for rash.  Neurological: Negative.   Psychiatric/Behavioral: Negative for depression and substance abuse. The patient is nervous/anxious. The patient does not have insomnia.      Medications: Current Outpatient Prescriptions  Medication Sig Dispense Refill  . valACYclovir (VALTREX) 1000 MG tablet Take 1 tablet (1,000 mg total) by mouth 3 (three) times daily. 21 tablet 0   No current facility-administered medications for this visit.    Allergies  Allergen Reactions  . Buspar [Buspirone]     Nausea  . Paxil [Paroxetine Hcl]     Suicidal thoughts       Objective:  BP 127/86   Pulse 84   Wt 182 lb (82.6 kg)   BMI 28.51 kg/m  Gen: well-groomed, cooperative, not ill-appearing, no distress HEENT: normal conjunctiva, TM's clear, oropharynx clear, moist mucus membranes, no thyromegaly or tenderness Lungs: Normal work of breathing, clear to auscultation bilaterally Heart: Normal rate, regular rhythm, s1  and s2 distinct, no murmurs, clicks or rubs  Abd: Soft. Nondistended, Nontender, no masses GU: circumcised penis with dorsal skin bridge, normal urethral meatus, normal scrotum, left scrotum mildly tender, 1cm nodule on posterior inferior pole of left testicle, normal right testicle, epididymis normal bilaterally, cord structures normal bilaterally, no evidence of inguinal hernias [chaperoned exam by Dr. Benjamin Stain Neuro: alert and oriented x 3, EOM's intact, PERRLA,  MSK: strength 5/5 and symmetric, normal gait Skin: warm and dry, no rashes, multiple non-suspicious nevi on upper back and  shoulders Psych: normal affect, pleasant mood, normal speech and thought content   Depression screen [facility] 2/9 05/30/2016  Decreased Interest 0  Down, Depressed, Hopeless 0  PHQ - 2 Score 0     Assessment and Plan: 31 y.o. male with   Left testicular pain and testicular nodule - US Scrotum; Future  Encounter for preventative adult health care examination - patient declined influenza vaccine - patient declined blood work due to cost - negative depression screen - discussed starting prostate cancer screening beginning at age 72 given patient's strong family history and concerns - counseled on healthy lifestyle    Patient education and anticipatory guidance given Patient agrees with treatment plan Follow-up in 1 year or sooner as needed  Levonne Hubert PA-C

## 2016-05-31 ENCOUNTER — Other Ambulatory Visit: Payer: Self-pay

## 2016-05-31 ENCOUNTER — Other Ambulatory Visit: Payer: Self-pay | Admitting: Physician Assistant

## 2016-05-31 ENCOUNTER — Ambulatory Visit (HOSPITAL_BASED_OUTPATIENT_CLINIC_OR_DEPARTMENT_OTHER)
Admission: RE | Admit: 2016-05-31 | Discharge: 2016-05-31 | Disposition: A | Discharge status: Home / Self Care | Payer: Medicare Other | Source: Ambulatory Visit | Attending: Physician Assistant | Admitting: Physician Assistant

## 2016-05-31 DIAGNOSIS — N503 Cyst of epididymis: Secondary | ICD-10-CM | POA: Insufficient documentation

## 2016-05-31 DIAGNOSIS — N50812 Left testicular pain: Secondary | ICD-10-CM

## 2016-05-31 DIAGNOSIS — N433 Hydrocele, unspecified: Secondary | ICD-10-CM | POA: Diagnosis not present

## 2016-06-03 ENCOUNTER — Other Ambulatory Visit: Payer: Self-pay

## 2016-07-01 DIAGNOSIS — N4889 Other specified disorders of penis: Secondary | ICD-10-CM | POA: Diagnosis not present

## 2016-07-01 DIAGNOSIS — N4340 Spermatocele of epididymis, unspecified: Secondary | ICD-10-CM | POA: Diagnosis not present

## 2016-07-01 DIAGNOSIS — N50819 Testicular pain, unspecified: Secondary | ICD-10-CM | POA: Diagnosis not present

## 2016-07-31 ENCOUNTER — Ambulatory Visit: Payer: Medicare Other | Admitting: Physician Assistant

## 2016-12-05 ENCOUNTER — Encounter: Payer: Self-pay | Admitting: Emergency Medicine

## 2016-12-05 ENCOUNTER — Emergency Department (INDEPENDENT_AMBULATORY_CARE_PROVIDER_SITE_OTHER)
Admission: EM | Admit: 2016-12-05 | Discharge: 2016-12-05 | Disposition: A | Discharge status: Home / Self Care | Payer: Medicare Other | Source: Home / Self Care | Attending: Family Medicine | Admitting: Family Medicine

## 2016-12-05 DIAGNOSIS — T63441A Toxic effect of venom of bees, accidental (unintentional), initial encounter: Secondary | ICD-10-CM | POA: Diagnosis not present

## 2016-12-05 DIAGNOSIS — M25462 Effusion, left knee: Secondary | ICD-10-CM

## 2016-12-05 MED ORDER — PREDNISONE 20 MG PO TABS: ORAL_TABLET | ORAL | 0 refills | Status: DC

## 2016-12-05 MED ORDER — CEPHALEXIN 500 MG PO CAPS: 500.0000 mg | ORAL_CAPSULE | Freq: Three times a day (TID) | ORAL | 0 refills | Status: DC

## 2016-12-05 MED ORDER — TRIAMCINOLONE ACETONIDE 0.1 % EX CREA: 1.0000 "application " | TOPICAL_CREAM | Freq: Two times a day (BID) | CUTANEOUS | 0 refills | Status: DC

## 2016-12-05 NOTE — ED Triage Notes (Signed)
Left knee bee sting, red swollen and painful. Left thumb not as bad, hurts a little

## 2016-12-05 NOTE — ED Provider Notes (Signed)
CSN: [id number]     Arrival date & time 12/05/16  1616 History   First MD Initiated Contact with Patient 12/05/16 1657     Chief Complaint  Patient presents with  . Insect Bite   (Consider location/radiation/quality/duration/timing/severity/associated sxs/prior Treatment) HPI  Austin Gill is a 31 y.o. male presenting to UC with c/o Left knee swelling, redness, and itching that started yesterday after being stung by a yellow-jacket.  Pain is sharp, 5/10 but minimal at this time. Denies decreased ROM.  Denies fever, chills, nausea, or trouble breathing.  He also got stung on Left hand but states that area is barely visible.    Past Medical History:  Diagnosis Date  . Anxiety   . Depression   . GERD (gastroesophageal reflux disease)   . Stutter    History reviewed. No pertinent surgical history. Family History  Problem Relation Age of Onset  . Benign prostatic hyperplasia Father   . Prostate cancer Paternal Grandfather   . Prostate cancer Paternal Uncle    Social History  Substance Use Topics  . Smoking status: Never Smoker  . Smokeless tobacco: Never Used  . Alcohol use 0.6 oz/week    1 Cans of beer per week    Review of Systems  Constitutional: Negative for chills and fever.  HENT: Negative for sore throat, trouble swallowing and voice change.   Respiratory: Negative for chest tightness, shortness of breath and wheezing.   Gastrointestinal: Negative for nausea and vomiting.  Musculoskeletal: Positive for arthralgias (minimal) and joint swelling. Negative for back pain and myalgias.  Skin: Positive for color change. Negative for wound.  Neurological: Negative for weakness and numbness.    Allergies  Buspar [buspirone] and Paxil [paroxetine hcl]  Home Medications   Prior to Admission medications   Medication Sig Start Date End Date Taking? Authorizing Provider  acetaminophen (TYLENOL) 325 MG tablet Take 650 mg by mouth every 6 (six) hours as needed.   Yes [provider]  omeprazole (PRILOSEC) 40 MG capsule Take 40 mg by mouth daily.   Yes [provider]  cephALEXin (KEFLEX) 500 MG capsule Take 1 capsule (500 mg total) by mouth 3 (three) times daily. 12/05/16   Lurene ShadowPhelps, Natacia Chaisson O, PA-C  predniSONE (DELTASONE) 20 MG tablet 3 tabs po day one, then 2 po daily x 4 days 12/05/16   Lurene ShadowPhelps, Vivianne Carles O, PA-C  triamcinolone cream (KENALOG) 0.1 % Apply 1 application topically 2 (two) times daily. 12/05/16   Lurene ShadowPhelps, Ladavia Lindenbaum O, PA-C   Meds Ordered and Administered this Visit  Medications - No data to display  BP (!) 141/93 (BP Location: Left Arm)   Pulse 72   Temp 98.3 F (36.8 C) (Oral)   Wt 172 lb (78 kg)   SpO2 96%   BMI 26.94 kg/m  No data found.   Physical Exam  Constitutional: He is oriented to person, place, and time. He appears well-developed and well-nourished.  HENT:  Head: Normocephalic and atraumatic.  Eyes: EOM are normal.  Neck: Normal range of motion.  Cardiovascular: Normal rate.   Pulmonary/Chest: Effort normal.  Musculoskeletal: Normal range of motion. He exhibits edema. He exhibits no tenderness.  Left knee: mild to moderate edema. Full ROM. Non-tender.  Calf is soft, non-tender.  Neurological: He is alert and oriented to person, place, and time.  Skin: Skin is warm and dry. There is erythema.  Left knee: 5x6 cm area of faint erythema to anterior aspect knee. Centralized pinpoint abrasion c/w sting. No bleeding  or discharge. No induration.   Psychiatric: He has a normal mood and affect. His behavior is normal.  Nursing note and vitals reviewed.   Urgent Care Course     Procedures (including critical care time)  Labs Review Labs Reviewed - No data to display  Imaging Review No results found.    MDM   1. Bee sting reaction, accidental or unintentional, initial encounter   2. Swelling of joint of left knee    Left knee swelling and redness of Left knee. No warmth or tenderness noted on exam. Full ROM  Symptoms  likely due to local reaction Rx: Prednisone and triamcinolone cream Prescription to hold for Keflex. Should start if he develops pain, warmth to area, worsening swelling, fever or chills May take acetaminophen and ibuprofen as needed F/u with PCP or return to UC if needed in 1 week if not improving, sooner if significantly worsening.    Lurene Shadowhelps, Early Ord O, PA-C 12/05/16 1758

## 2016-12-05 NOTE — Discharge Instructions (Signed)
°  Today it appears your symptoms are due to a reaction to the bee sting and NOT from infection.  If you do develop pain or warmth to your knee, fever, or chills, please start the antibiotic as prescribed.

## 2016-12-20 DIAGNOSIS — R42 Dizziness and giddiness: Secondary | ICD-10-CM | POA: Diagnosis not present

## 2016-12-20 DIAGNOSIS — I6529 Occlusion and stenosis of unspecified carotid artery: Secondary | ICD-10-CM | POA: Diagnosis not present

## 2016-12-20 DIAGNOSIS — I672 Cerebral atherosclerosis: Secondary | ICD-10-CM | POA: Diagnosis not present

## 2016-12-20 DIAGNOSIS — Z888 Allergy status to other drugs, medicaments and biological substances status: Secondary | ICD-10-CM | POA: Diagnosis not present

## 2016-12-20 DIAGNOSIS — E876 Hypokalemia: Secondary | ICD-10-CM | POA: Diagnosis not present

## 2016-12-20 DIAGNOSIS — Z87891 Personal history of nicotine dependence: Secondary | ICD-10-CM | POA: Diagnosis not present

## 2016-12-20 DIAGNOSIS — M542 Cervicalgia: Secondary | ICD-10-CM | POA: Diagnosis not present

## 2016-12-20 DIAGNOSIS — R51 Headache: Secondary | ICD-10-CM | POA: Diagnosis not present

## 2016-12-20 DIAGNOSIS — M436 Torticollis: Secondary | ICD-10-CM | POA: Diagnosis not present

## 2016-12-20 DIAGNOSIS — R202 Paresthesia of skin: Secondary | ICD-10-CM | POA: Diagnosis not present

## 2016-12-20 DIAGNOSIS — I700 Atherosclerosis of aorta: Secondary | ICD-10-CM | POA: Diagnosis not present

## 2017-01-28 ENCOUNTER — Emergency Department (INDEPENDENT_AMBULATORY_CARE_PROVIDER_SITE_OTHER)
Admission: EM | Admit: 2017-01-28 | Discharge: 2017-01-28 | Disposition: A | Discharge status: Home / Self Care | Payer: Medicare Other | Source: Home / Self Care | Attending: Family Medicine | Admitting: Family Medicine

## 2017-01-28 DIAGNOSIS — J029 Acute pharyngitis, unspecified: Secondary | ICD-10-CM | POA: Diagnosis not present

## 2017-01-28 DIAGNOSIS — Z0189 Encounter for other specified special examinations: Secondary | ICD-10-CM | POA: Diagnosis not present

## 2017-01-28 DIAGNOSIS — Z20818 Contact with and (suspected) exposure to other bacterial communicable diseases: Secondary | ICD-10-CM

## 2017-01-28 LAB — POCT RAPID STREP A (OFFICE): Rapid Strep A Screen: NEGATIVE

## 2017-01-28 MED ORDER — IPRATROPIUM BROMIDE 0.06 % NA SOLN: 2.0000 | Freq: Four times a day (QID) | NASAL | 1 refills | Status: DC

## 2017-01-28 MED ORDER — PREDNISONE 20 MG PO TABS: ORAL_TABLET | ORAL | 0 refills | Status: DC

## 2017-01-28 NOTE — Discharge Instructions (Signed)
°  You may take 500mg acetaminophen every 4-6 hours or in combination with ibuprofen 400-600mg every 6-8 hours as needed for pain, inflammation, and fever. ° °Be sure to drink at least eight 8oz glasses of water to stay well hydrated and get at least 8 hours of sleep at night, preferably more while sick.  ° °

## 2017-01-28 NOTE — ED Provider Notes (Signed)
[facility]    CSN: [id number] Arrival date & time: 01/28/17  1314     History   Chief Complaint Chief Complaint  Patient presents with  . Sore Throat    HPI Austin Gill is a 31 y.o. male.   HPI  Austin Gill is a 31 y.o. male presenting to UC with c/o not feeling well the last 3-4 days with associated sore throat, yellow nasal drainage, minimal cough and subjective fever last night. His sister was dx with strep throat recently. He has taken ibuprofen with mild relief. Denies n/v/d.    Past Medical History:  Diagnosis Date  . Anxiety   . Depression   . GERD (gastroesophageal reflux disease)   . Stutter     Patient Active Problem List   Diagnosis Date Noted  . Left testicular pain 05/30/2016  . Testicular nodule 05/30/2016  . Overweight (BMI 25.0-29.9) 05/30/2016  . Facial lesion 11/17/2014  . Adjustment disorder with mixed anxiety and depressed mood 11/23/2013  . Social anxiety disorder 11/23/2013  . Dysthymia 11/23/2013  . Essential tremor 09/28/2013  . Fatty liver 12/07/2012  . Sphenoidal sinus polyp 12/07/2012  . Anxiety and depression 11/10/2012  . GERD (gastroesophageal reflux disease) 11/10/2012  . Stutter 11/10/2012    History reviewed. No pertinent surgical history.     Home Medications    Prior to Admission medications   Medication Sig Start Date End Date Taking? Authorizing Provider  acetaminophen (TYLENOL) 325 MG tablet Take 650 mg by mouth every 6 (six) hours as needed.    [provider]  cephALEXin (KEFLEX) 500 MG capsule Take 1 capsule (500 mg total) by mouth 3 (three) times daily. 12/05/16   Lurene Shadow, PA-C  ipratropium (ATROVENT) 0.06 % nasal spray Place 2 sprays into both nostrils 4 (four) times daily. 01/28/17   Lurene Shadow, PA-C  omeprazole (PRILOSEC) 40 MG capsule Take 40 mg by mouth daily.    [provider]  predniSONE (DELTASONE) 20 MG tablet 3 tabs po day one, then 2 po daily x 4 days 01/28/17    Lurene Shadow, PA-C  triamcinolone cream (KENALOG) 0.1 % Apply 1 application topically 2 (two) times daily. 12/05/16   Lurene Shadow, PA-C    Family History Family History  Problem Relation Age of Onset  . Benign prostatic hyperplasia Father   . Prostate cancer Paternal Grandfather   . Prostate cancer Paternal Uncle     Social History Social History  Substance Use Topics  . Smoking status: Never Smoker  . Smokeless tobacco: Never Used  . Alcohol use 0.6 oz/week    1 Cans of beer per week     Allergies   Buspar [buspirone] and Paxil [paroxetine hcl]   Review of Systems Review of Systems  Constitutional: Positive for fatigue and fever. Negative for chills.  HENT: Positive for congestion, rhinorrhea and sore throat. Negative for ear pain, trouble swallowing and voice change.   Respiratory: Positive for cough (minimal). Negative for shortness of breath.   Cardiovascular: Negative for chest pain and palpitations.  Gastrointestinal: Negative for abdominal pain, diarrhea, nausea and vomiting.  Musculoskeletal: Negative for arthralgias, back pain and myalgias.  Skin: Negative for rash.  Neurological: Negative for dizziness, light-headedness and headaches.     Physical Exam Triage Vital Signs ED Triage Vitals  Enc Vitals Group     BP 01/28/17 1350 116/74     Pulse Rate 01/28/17 1350 73     Resp --  Temp 01/28/17 1350 99.1 F (37.3 C)     Temp Source 01/28/17 1350 Oral     SpO2 01/28/17 1350 98 %     Weight 01/28/17 1351 173 lb (78.5 kg)     Height 01/28/17 1351  (1.702 m)     Head Circumference --      Peak Flow --      Pain Score 01/28/17 1351 6     Pain Loc --      Pain Edu? --      Excl. in GC? --    No data found.   Updated Vital Signs BP 116/74 (BP Location: Left Arm)   Pulse 73   Temp 99.1 F (37.3 C) (Oral)   Ht  (1.702 m)   Wt 173 lb (78.5 kg)   SpO2 98%   BMI 27.10 kg/m   Visual Acuity Right Eye Distance:   Left Eye Distance:     Bilateral Distance:    Right Eye Near:   Left Eye Near:    Bilateral Near:     Physical Exam  Constitutional: He is oriented to person, place, and time. He appears well-developed and well-nourished. No distress.  HENT:  Head: Normocephalic and atraumatic.  Right Ear: Tympanic membrane normal.  Left Ear: Tympanic membrane normal.  Nose: Nose normal.  Mouth/Throat: Uvula is midline and mucous membranes are normal. Posterior oropharyngeal edema and posterior oropharyngeal erythema present. No oropharyngeal exudate or tonsillar abscesses. Tonsils are 2+ on the right. Tonsils are 2+ on the left.  Eyes: EOM are normal.  Neck: Normal range of motion.  Cardiovascular: Normal rate and regular rhythm.   Pulmonary/Chest: Effort normal and breath sounds normal. No stridor. No respiratory distress. He has no wheezes. He has no rales.  Musculoskeletal: Normal range of motion.  Lymphadenopathy:    He has cervical adenopathy ( Left side).  Neurological: He is alert and oriented to person, place, and time.  Skin: Skin is warm and dry. He is not diaphoretic.  Psychiatric: He has a normal mood and affect. His behavior is normal.  Nursing note and vitals reviewed.    UC Treatments / Results  Labs (all labs ordered are listed, but only abnormal results are displayed) Labs Reviewed  STREP A DNA PROBE  POCT RAPID STREP A (OFFICE)    EKG  EKG Interpretation None       Radiology No results found.  Procedures Procedures (including critical care time)  Medications Ordered in UC Medications - No data to display   Initial Impression / Assessment and Plan / UC Course  I have reviewed the triage vital signs and the nursing notes.  Pertinent labs & imaging results that were available during my care of the patient were reviewed by me and considered in my medical decision making (see chart for details).     Rapid strep: negative Culture sent  Encouraged symptomatic treatment at this  time F/u with PCP in 1 week if needed.   Final Clinical Impressions(s) / UC Diagnoses   Final diagnoses:  Pharyngitis, unspecified etiology  Exposure to strep throat    New Prescriptions New Prescriptions   IPRATROPIUM (ATROVENT) 0.06 % NASAL SPRAY    Place 2 sprays into both nostrils 4 (four) times daily.   PREDNISONE (DELTASONE) 20 MG TABLET    3 tabs po day one, then 2 po daily x 4 days     Controlled Substance Prescriptions [place] Controlled Substance Registry consulted? Not Applicable   Waylan Rocher  O, PA-C 01/28/17 1423

## 2017-01-28 NOTE — ED Triage Notes (Signed)
Pt has not been feeling well for several days.  Cold Sunday, sore throat Monday, Yellow drainage Monday, had a fever last night.

## 2017-01-29 ENCOUNTER — Telehealth: Payer: Self-pay

## 2017-01-29 LAB — STREP A DNA PROBE: Group A Strep Probe: NOT DETECTED

## 2017-01-29 NOTE — Telephone Encounter (Signed)
Left VM with negative result, and to call UC if any questions or concerns.

## 2017-04-07 ENCOUNTER — Ambulatory Visit: Payer: Self-pay | Admitting: Physician Assistant

## 2017-04-07 DIAGNOSIS — Z0189 Encounter for other specified special examinations: Secondary | ICD-10-CM

## 2017-05-16 ENCOUNTER — Ambulatory Visit: Payer: Self-pay | Admitting: Physician Assistant

## 2017-05-16 DIAGNOSIS — Z0189 Encounter for other specified special examinations: Secondary | ICD-10-CM

## 2017-06-06 ENCOUNTER — Ambulatory Visit (INDEPENDENT_AMBULATORY_CARE_PROVIDER_SITE_OTHER): Payer: Medicare Other | Admitting: Physician Assistant

## 2017-06-06 ENCOUNTER — Encounter: Payer: Self-pay | Admitting: Physician Assistant

## 2017-06-06 ENCOUNTER — Other Ambulatory Visit: Payer: Self-pay

## 2017-06-06 VITALS — BP 108/70 | HR 79 | Temp 98.6°F | Wt 173.0 lb

## 2017-06-06 DIAGNOSIS — N529 Male erectile dysfunction, unspecified: Secondary | ICD-10-CM | POA: Diagnosis not present

## 2017-06-06 DIAGNOSIS — M545 Low back pain, unspecified: Secondary | ICD-10-CM | POA: Insufficient documentation

## 2017-06-06 DIAGNOSIS — J069 Acute upper respiratory infection, unspecified: Secondary | ICD-10-CM | POA: Diagnosis not present

## 2017-06-06 LAB — POCT RAPID STREP A (OFFICE): RAPID STREP A SCREEN: NEGATIVE

## 2017-06-06 MED ORDER — PREDNISONE 50 MG PO TABS: ORAL_TABLET | ORAL | 0 refills | Status: DC

## 2017-06-06 MED ORDER — SILDENAFIL CITRATE 20 MG PO TABS: ORAL_TABLET | ORAL | 3 refills | Status: DC

## 2017-06-06 MED ORDER — IPRATROPIUM BROMIDE 0.06 % NA SOLN: 2.0000 | Freq: Four times a day (QID) | NASAL | 0 refills | Status: DC | PRN

## 2017-06-06 MED ORDER — CYCLOBENZAPRINE HCL 5 MG PO TABS: 5.0000 mg | ORAL_TABLET | Freq: Every evening | ORAL | 0 refills | Status: DC | PRN

## 2017-06-06 MED ORDER — BENZONATATE 200 MG PO CAPS: 200.0000 mg | ORAL_CAPSULE | Freq: Three times a day (TID) | ORAL | 0 refills | Status: DC | PRN

## 2017-06-06 MED ORDER — GUAIFENESIN ER 600 MG PO TB12: 600.0000 mg | ORAL_TABLET | Freq: Two times a day (BID) | ORAL | Status: DC

## 2017-06-06 NOTE — Progress Notes (Signed)
HPI:                                                                Austin Gill is a 32 y.o. male who presents to [facility]: Primary Care Sports Medicine today for URI symptoms, back pain, ED symptoms  URI   This is a new problem. The current episode started in the past 7 days. The problem has been unchanged. There has been no fever. Associated symptoms include congestion, coughing, rhinorrhea and a sore throat. Pertinent negatives include no abdominal pain, headaches, joint pain, neck pain, rash or wheezing. He has tried acetaminophen and sleep for the symptoms.  Back Pain  This is a new problem. The current episode started in the past 7 days. The problem occurs constantly. The pain is present in the lumbar spine. The quality of the pain is described as stabbing. The pain does not radiate. The pain is severe. The symptoms are aggravated by standing. Pertinent negatives include no abdominal pain, bladder incontinence, bowel incontinence, headaches, paresthesias, perianal numbness or tingling. He has tried analgesics, NSAIDs and ice for the symptoms.  Erectile Dysfunction  This is a chronic problem. The current episode started more than 1 year ago. The problem is unchanged. The nature of his difficulty is achieving erection, maintaining erection and penetration. Non-physiologic factors contributing to erectile dysfunction are anxiety and a decreased libido. Irritative symptoms do not include frequency, nocturia or urgency. The symptoms are aggravated by alcohol use. Past treatments include nothing.   SHIM    Row Name 06/06/17 1249         SHIM: Over the last 6 months:   How do you rate your confidence that you could get and keep an erection?  Low     When you had erections with sexual stimulation, how often were your erections hard enough for penetration (entering your partner)?  Almost Never or Never     During sexual intercourse, how often were you able to maintain your  erection after you had penetrated (entered) your partner?  Almost Never or Never     During sexual intercourse, how difficult was it to maintain your erection to completion of intercourse?  Very Difficult     When you attempted sexual intercourse, how often was it satisfactory for you?  A Few Times (much less than half the time)       SHIM Total Score   SHIM  8          Depression screen [facility] 2/9 05/30/2016  Decreased Interest 0  Down, Depressed, Hopeless 0  PHQ - 2 Score 0    No flowsheet data found.    Past Medical History:  Diagnosis Date  . Anxiety   . Depression   . GERD (gastroesophageal reflux disease)   . Stutter    History reviewed. No pertinent surgical history. Social History   Tobacco Use  . Smoking status: Never Smoker  . Smokeless tobacco: Never Used  Substance Use Topics  . Alcohol use: Yes    Alcohol/week: 0.6 oz    Types: 1 Cans of beer per week   family history includes Benign prostatic hyperplasia in his father; Prostate cancer in his paternal grandfather and paternal uncle.    ROS: negative except  as noted in the HPI  Medications: Current Outpatient Medications  Medication Sig Dispense Refill  . benzonatate (TESSALON) 200 MG capsule Take 1 capsule (200 mg total) by mouth 3 (three) times daily as needed for cough. 30 capsule 0  . cyclobenzaprine (FLEXERIL) 5 MG tablet Take 1-2 tablets (5-10 mg total) by mouth at bedtime as needed for muscle spasms. 30 tablet 0  . guaiFENesin (MUCINEX) 600 MG 12 hr tablet Take 1 tablet (600 mg total) by mouth 2 (two) times daily.    Marland Kitchen ipratropium (ATROVENT) 0.06 % nasal spray Place 2 sprays into both nostrils 4 (four) times daily as needed. 15 mL 0  . omeprazole (PRILOSEC) 40 MG capsule Take 40 mg by mouth daily.    . predniSONE (DELTASONE) 50 MG tablet One tab PO daily for 5 days. 5 tablet 0  . sildenafil (REVATIO) 20 MG tablet 1-5 tablets PO prn 30-60 minutes before sexual activity 50 tablet 3   No current  facility-administered medications for this visit.    Allergies  Allergen Reactions  . Buspar [Buspirone]     Nausea  . Paxil [Paroxetine Hcl]     Suicidal thoughts       Objective:  BP 108/70   Pulse 79   Temp 98.6 F (37 C) (Oral)   Wt 173 lb (78.5 kg)   BMI 27.10 kg/m  Gen:  alert, not ill-appearing, no distress, appropriate for age HEENT: head normocephalic without obvious abnormality, conjunctiva and cornea clear, TM's clear bilaterally, oropharynx with erythema, no edema or exudates, uvula midline, moist mucous membranes, neck supple, no adenopathy, trachea midline Pulm: Normal work of breathing, normal phonation, clear to auscultation bilaterally, no wheezes, rales or rhonchi CV: Normal rate, regular rhythm, s1 and s2 distinct, no murmurs, clicks or rubs  Neuro: alert and oriented x 3, DTR's intact, sensation intact MSK: back atraumatic, there is midline tenderness of the lumbosacral area, ROM limited with flexion and lateral bend, lower extremitiy strength 5/5 and symmetric, extremities atraumatic, normal gait and station Skin: intact, no rashes on exposed skin, no jaundice, no cyanosis Psych: well-groomed, cooperative, good eye contact, euthymic mood, affect mood-congruent, moderate stutter, and thought processes clear and goal-directed    Results for orders placed or performed in visit on 06/06/17 (from the past 72 hour(s))  POCT rapid strep A     Status: Normal   Collection Time: 06/06/17 11:10 AM  Result Value Ref Range   Rapid Strep A Screen Negative Negative   No results found.    Assessment and Plan: 32 y.o. male with   Acute upper respiratory infection - Plan: POCT rapid strep A, DISCONTINUED: ipratropium (ATROVENT) 0.06 % nasal spray, DISCONTINUED: benzonatate (TESSALON) 200 MG capsule, DISCONTINUED: guaiFENesin (MUCINEX) 600 MG 12 hr tablet  Acute midline low back pain without sciatica - no radicular symptoms, saddle numbness, bowel/bladder dysfunction  - Plan: DISCONTINUED: predniSONE (DELTASONE) 50 MG tablet, DISCONTINUED: cyclobenzaprine (FLEXERIL) 5 MG tablet  Erectile dysfunction, unspecified erectile dysfunction type - SHIM score 8 06/06/2017 - Plan: DISCONTINUED: sildenafil (REVATIO) 20 MG tablet  Patient Instructions  For sore throat/cough: - Atrovent nasal spray  - Mucinex to make cough more productive - Tessalon - Warm, salt water gargles - Drink plenty of water (at least 8 glasses per day)  For back pain: - Prednisone daily for 5 days - Stop Ibuprofen - Once you complete the Prednisone, take Naproxen (Advil) 1-2 capsules twice a day as needed for pain - Flexeril at bedtime - Rehab exercises 1-2  times per day - Ice/heat (whichever feels better) x 20 minutes every 2 hours as needed - Avoid all lifting and activities that cause sharp pain - Avoid bed rest. Try to do as many of your routine daily activities and stay active - Follow-up if no improvement in 2 weeks  For ED: - Sildenafil, 30 -60 minutes before sexual activity - Take 1 tablet the first time and gradually increase dose until effective - Max dose is 5 tablets - Do not use more than once per day   Patient education and anticipatory guidance given Patient agrees with treatment plan Follow-up as needed if symptoms worsen or fail to improve  I spent 40 minutes with this patient, greater than 50% was face-to-face time counseling regarding the above diagnoses  Levonne Hubertharley E. Navaya Wiatrek PA-C

## 2017-06-06 NOTE — Patient Instructions (Addendum)
For sore throat/cough: - Atrovent nasal spray  - Mucinex to make cough more productive - Tessalon - Warm, salt water gargles - Drink plenty of water (at least 8 glasses per day)  For back pain: - Prednisone daily for 5 days - Stop Ibuprofen - Once you complete the Prednisone, take Naproxen (Advil) 1-2 capsules twice a day as needed for pain - Flexeril at bedtime - Rehab exercises 1-2 times per day - Ice/heat (whichever feels better) x 20 minutes every 2 hours as needed - Avoid all lifting and activities that cause sharp pain - Avoid bed rest. Try to do as many of your routine daily activities and stay active - Follow-up if no improvement in 2 weeks  For ED: - Sildenafil, 30 -60 minutes before sexual activity - Take 1 tablet the first time and gradually increase dose until effective - Max dose is 5 tablets - Do not use more than once per day   Erectile Dysfunction Erectile dysfunction (ED) is the inability to get or keep an erection in order to have sexual intercourse. Erectile dysfunction may include:  Inability to get an erection.  Lack of enough hardness of the erection to allow penetration.  Loss of the erection before sex is finished.  What are the causes? This condition may be caused by:  Certain medicines, such as: ? Pain relievers. ? Antihistamines. ? Antidepressants. ? Blood pressure medicines. ? Water pills (diuretics). ? Ulcer medicines. ? Muscle relaxants. ? Drugs.  Excessive drinking.  Psychological causes, such as: ? Anxiety. ? Depression. ? Sadness. ? Exhaustion. ? Performance fear. ? Stress.  Physical causes, such as: ? Artery problems. This may include diabetes, smoking, liver disease, or atherosclerosis. ? High blood pressure. ? Hormonal problems, such as low testosterone. ? Obesity. ? Nerve problems. This may include back or pelvic injuries, diabetes mellitus, multiple sclerosis, or Parkinson disease.  What are the signs or  symptoms? Symptoms of this condition include:  Inability to get an erection.  Lack of enough hardness of the erection to allow penetration.  Loss of the erection before sex is finished.  Normal erections at some times, but with frequent unsatisfactory episodes.  Low sexual satisfaction in either partner due to erection problems.  A curved penis occurring with erection. The curve may cause pain or the penis may be too curved to allow for intercourse.  Never having nighttime erections.  How is this diagnosed? This condition is often diagnosed by:  Performing a physical exam to find other diseases or specific problems with the penis.  Asking you detailed questions about the problem.  Performing blood tests to check for diabetes mellitus or to measure hormone levels.  Performing other tests to check for underlying health conditions.  Performing an ultrasound exam to check for scarring.  Performing a test to check blood flow to the penis.  Doing a sleep study at home to measure nighttime erections.  How is this treated? This condition may be treated by:  Medicine taken by mouth to help you achieve an erection (oral medicine).  Hormone replacement therapy to replace low testosterone levels.  Medicine that is injected into the penis. Your health care provider may instruct you how to give yourself these injections at home.  Vacuum pump. This is a pump with a ring on it. The pump and ring are placed on the penis and used to create pressure that helps the penis become erect.  Penile implant surgery. In this procedure, you may receive: ? An inflatable  implant. This consists of cylinders, a pump, and a reservoir. The cylinders can be inflated with a fluid that helps to create an erection, and they can be deflated after intercourse. ? A semi-rigid implant. This consists of two silicone rubber rods. The rods provide some rigidity. They are also flexible, so the penis can both curve  downward in its normal position and become straight for sexual intercourse.  Blood vessel surgery, to improve blood flow to the penis. During this procedure, a blood vessel from a different part of the body is placed into the penis to allow blood to flow around (bypass) damaged or blocked blood vessels.  Lifestyle changes, such as exercising more, losing weight, and quitting smoking.  Follow these instructions at home: Medicines  Take over-the-counter and prescription medicines only as told by your health care provider. Do not increase the dosage without first discussing it with your health care provider.  If you are using self-injections, perform injections as directed by your health care provider. Make sure to avoid any veins that are on the surface of the penis. After giving an injection, apply pressure to the injection site for 5 minutes. General instructions  Exercise regularly, as directed by your health care provider. Work with your health care provider to lose weight, if needed.  Do not use any products that contain nicotine or tobacco, such as cigarettes and e-cigarettes. If you need help quitting, ask your health care provider.  Before using a vacuum pump, read the instructions that come with the pump and discuss any questions with your health care provider.  Keep all follow-up visits as told by your health care provider. This is important. Contact a health care provider if:  You feel nauseous.  You vomit. Get help right away if:  You are taking oral or injectable medicines and you have an erection that lasts longer than 4 hours. If your health care provider is unavailable, go to the nearest emergency room for evaluation. An erection that lasts much longer than 4 hours can result in permanent damage to your penis.  You have severe pain in your groin or abdomen.  You develop redness or severe swelling of your penis.  You have redness spreading up into your groin or lower  abdomen.  You are unable to urinate.  You experience chest pain or a rapid heart beat (palpitations) after taking oral medicines. Summary  Erectile dysfunction (ED) is the inability to get or keep an erection during sexual intercourse. This problem can usually be treated successfully.  This condition is diagnosed based on a physical exam, your symptoms, and tests to determine the cause. Treatment varies depending on the cause, and may include medicines, hormone therapy, surgery, or vacuum pump.  You may need follow-up visits to make sure that you are using your medicines or devices correctly.  Get help right away if you are taking or injecting medicines and you have an erection that lasts longer than 4 hours. This information is not intended to replace advice given to you by your health care provider. Make sure you discuss any questions you have with your health care provider. Document Released: 04/19/2000 Document Revised: 05/08/2016 Document Reviewed: 05/08/2016 Elsevier Interactive Patient Education  2017 [employer].

## 2017-07-14 ENCOUNTER — Ambulatory Visit: Payer: Self-pay | Admitting: Physician Assistant

## 2017-07-14 DIAGNOSIS — Z0189 Encounter for other specified special examinations: Secondary | ICD-10-CM

## 2017-07-16 ENCOUNTER — Emergency Department (INDEPENDENT_AMBULATORY_CARE_PROVIDER_SITE_OTHER)
Admission: EM | Admit: 2017-07-16 | Discharge: 2017-07-16 | Disposition: A | Discharge status: Home / Self Care | Payer: Medicare Other | Source: Home / Self Care | Attending: Emergency Medicine | Admitting: Emergency Medicine

## 2017-07-16 ENCOUNTER — Emergency Department (INDEPENDENT_AMBULATORY_CARE_PROVIDER_SITE_OTHER): Payer: Medicare Other

## 2017-07-16 ENCOUNTER — Other Ambulatory Visit: Payer: Self-pay

## 2017-07-16 DIAGNOSIS — M5416 Radiculopathy, lumbar region: Secondary | ICD-10-CM | POA: Diagnosis not present

## 2017-07-16 DIAGNOSIS — M545 Low back pain, unspecified: Secondary | ICD-10-CM

## 2017-07-16 DIAGNOSIS — M79605 Pain in left leg: Secondary | ICD-10-CM

## 2017-07-16 LAB — POCT URINALYSIS DIP (MANUAL ENTRY)
Bilirubin, UA: NEGATIVE
Blood, UA: NEGATIVE
Glucose, UA: NEGATIVE mg/dL
Ketones, POC UA: NEGATIVE mg/dL
Leukocytes, UA: NEGATIVE
Nitrite, UA: NEGATIVE
Protein Ur, POC: NEGATIVE mg/dL
Spec Grav, UA: 1.01 (ref 1.010–1.025)
Urobilinogen, UA: 0.2 E.U./dL
pH, UA: 7 (ref 5.0–8.0)

## 2017-07-16 MED ORDER — IBUPROFEN 200 MG PO TABS: ORAL_TABLET | ORAL | 0 refills | Status: DC

## 2017-07-16 NOTE — Discharge Instructions (Signed)
X-ray of lumbar spine normal. Urine test completely normal. No sign of kidney stone or urinary infection. Likely have a low back strain, but it's possible that you could've inflamed one of the nerves, causing some pain going down left leg. Today, we offered to give you a pain shot and also offered prednisone or muscle relaxant by mouth,but you declined. As we discussed, okay to take ibuprofen, up to 600 mg every 6-8 hours with food as needed for pain. Be sure to follow-up with your primary care provider in a week to recheck

## 2017-07-16 NOTE — ED Provider Notes (Signed)
[facility]    CSN: [id number] Arrival date & time: 07/16/17  1417     History   Chief Complaint Chief Complaint  Patient presents with  . Back Pain  . Foot Pain    HPI Austin Gill is a 32 y.o. male.   The history is provided by the patient.  Back Pain  Location:  Lumbar spine Quality:  Stabbing and shooting (Sharp) Radiates to:  L thigh and L foot Pain severity:  Severe (9 out of 10) Onset quality:  Sudden (acute onset yesterday, however he had similar low back pain 1 month ago that he self treated with ibuprofen and rest and eventually resolved.) Timing:  Constant Progression:  Unchanged Context: not recent illness and not recent injury   Associated symptoms: no fever   Foot Pain    Chief complaint is lower lumbar pain, mostly in the midline, acute severe onset without any known trauma, yesterday upon awakening.radiates to left thigh and to  Left foot. He recalls no injury of left foot. Has occasional numbness lateral aspect of left foot and left calf. This is associated with his low back pain. When he had lumbar pain in February, he was seen by his PCP 06/06/17 who diagnosed lumbar pain, and prescribed an oral prednisone burst. And muscle relaxant. His symptoms resolved after 2 weeks, but then recurred yesterday. Other associated symptoms: Denies incontinence.No dysuria or hematuria. No other problems with voiding. No saddle anesthesia.  He denies ever having low back surgery and prior to last month, denies having back problems. Past Medical History:  Diagnosis Date  . Anxiety   . Depression   . GERD (gastroesophageal reflux disease)   . Stutter     Patient Active Problem List   Diagnosis Date Noted  . Acute midline low back pain without sciatica 06/06/2017  . Erectile dysfunction 06/06/2017  . Left testicular pain 05/30/2016  . Testicular nodule 05/30/2016  . Overweight (BMI 25.0-29.9) 05/30/2016  . Facial lesion 11/17/2014  . Adjustment  disorder with mixed anxiety and depressed mood 11/23/2013  . Social anxiety disorder 11/23/2013  . Dysthymia 11/23/2013  . Essential tremor 09/28/2013  . Fatty liver 12/07/2012  . Sphenoidal sinus polyp 12/07/2012  . Anxiety and depression 11/10/2012  . GERD (gastroesophageal reflux disease) 11/10/2012  . Stutter 11/10/2012    History reviewed. No pertinent surgical history.     Home Medications    Prior to Admission medications   Medication Sig Start Date End Date Taking? Authorizing Provider  ibuprofen (ADVIL,MOTRIN) 200 MG tablet Take three tablets ( 600 milligrams total) every 6 with food as needed for pain. 07/16/17   Lajean ManesMassey, Kelcy Baeten, MD  ipratropium (ATROVENT) 0.06 % nasal spray Place 2 sprays into both nostrils 4 (four) times daily as needed. 06/06/17   Carlis Stableummings, Charley Elizabeth, PA-C  omeprazole (PRILOSEC) 40 MG capsule Take 40 mg by mouth daily.    [provider]  sildenafil (REVATIO) 20 MG tablet 1-5 tablets PO prn 30-60 minutes before sexual activity 06/06/17   Carlis Stableummings, Charley Elizabeth, PA-C    Family History Family History  Problem Relation Age of Onset  . Benign prostatic hyperplasia Father   . Parkinson's disease Father   . Prostate cancer Paternal Grandfather   . Prostate cancer Paternal Uncle     Social History Social History   Tobacco Use  . Smoking status: Never Smoker  . Smokeless tobacco: Never Used  Substance Use Topics  . Alcohol use: No    Frequency: Never  .  Drug use: No     Allergies   Buspar [buspirone] and Paxil [paroxetine hcl]   Review of Systems Review of Systems  Constitutional: Negative for chills and fever.  HENT: Negative.   Respiratory: Negative.   Cardiovascular: Negative.   Gastrointestinal: Negative.   Musculoskeletal: Positive for back pain.  Skin: Negative for rash and wound.  All other systems reviewed and are negative.    Physical Exam Triage Vital Signs ED Triage Vitals  Enc Vitals Group     BP  07/16/17 1434 122/77     Pulse Rate 07/16/17 1434 76     Resp 07/16/17 1434 16     Temp 07/16/17 1434 98 F (36.7 C)     Temp Source 07/16/17 1434 Oral     SpO2 07/16/17 1434 98 %     Weight 07/16/17 1435 176 lb (79.8 kg)     Height 07/16/17 1435 5\' 8"  (1.727 m)     Head Circumference --      Peak Flow --      Pain Score 07/16/17 1435 9     Pain Loc --      Pain Edu? --      Excl. in GC? --    No data found.  Updated Vital Signs BP 122/77 (BP Location: Right Arm)   Pulse 76   Temp 98 F (36.7 C) (Oral)   Resp 16   Ht 5\' 8"  (1.727 m)   Wt 176 lb (79.8 kg)   SpO2 98%   BMI 26.76 kg/m   Visual Acuity Right Eye Distance:   Left Eye Distance:   Bilateral Distance:    Right Eye Near:   Left Eye Near:    Bilateral Near:     Physical Exam  Constitutional: He is oriented to person, place, and time. He appears well-developed and well-nourished. He is cooperative.  Non-toxic appearance. He appears distressed (Appears uncomfortable from low back pain.).  HENT:  Head: Normocephalic and atraumatic.  Mouth/Throat: Oropharynx is clear and moist.  Eyes: EOM are normal. Pupils are equal, round, and reactive to light. No scleral icterus.  Neck: Neck supple.  Cardiovascular: Regular rhythm and normal heart sounds.  Pulmonary/Chest: Effort normal and breath sounds normal. No respiratory distress. He has no wheezes. He has no rales. He exhibits no tenderness.  Abdominal: Soft. There is no tenderness.  Musculoskeletal:       Right hip: Normal.       Left hip: Normal.       Cervical back: He exhibits no tenderness.       Thoracic back: He exhibits no tenderness.       Lumbar back: He exhibits decreased range of motion, tenderness and spasm. He exhibits no swelling, no edema, no deformity, no laceration and normal pulse.  Negative Right straight leg-raise test.  Left straight leg-raise test,equivocally +45   Negative Right Luisa Hart test. Negative Left Luisa Hart test.      Neurological: He is alert and oriented to person, place, and time. He has normal strength. He displays no atrophy, no tremor and normal reflexes. No cranial nerve deficit or sensory deficit. He exhibits normal muscle tone. Gait normal.  Reflex Scores:      Patellar reflexes are 2+ on the right side and 2+ on the left side.      Achilles reflexes are 2+ on the right side and 2+ on the left side. Skin: Skin is warm, dry and intact. No lesion and no rash noted.  Psychiatric: He  has a normal mood and affect.  Nursing note and vitals reviewed.  left foot exam:normal. Nontender. Range of motion and function normal. No deformity. DP pulses intact. No edema. No calf swelling or tenderness or heat or cords.  UC Treatments / Results  Labs (all labs ordered are listed, but only abnormal results are displayed) Labs Reviewed  POCT URINALYSIS DIP (MANUAL ENTRY) - Abnormal; Notable for the following components:      Result Value   Color, UA light yellow (*)    All other components within normal limits    EKG  EKG Interpretation None       Radiology Dg Lumbar Spine Complete  Result Date: 07/16/2017 CLINICAL DATA:  Low back pain, left radiculopathy EXAM: LUMBAR SPINE - COMPLETE 4+ VIEW COMPARISON:  None. FINDINGS: There is no evidence of lumbar spine fracture. Alignment is normal. Intervertebral disc spaces are maintained. IMPRESSION: Negative. Electronically Signed   By: Charlett Nose M.D.   On: 07/16/2017 15:18    Procedures Procedures (including critical care time)  Medications Ordered in UC Medications - No data to display   Initial Impression / Assessment and Plan / UC Course  256 p.m.-initial evaluation done. Ordered urinalysis and x-ray LS-spine.  Urinalysis within normal limits.  X-ray LS-spine normal  I have reviewed the triage vital signs and the nursing notes.  Pertinent labs & imaging results that were available during my care of the patient were reviewed by me and  considered in my medical decision making (see chart for details).     Explained to patient that UA and x-ray LS-spine normal. His lumbar pain is likely from a deep musculoligamentous strain, and its possible that he has left-sided sciatica. However, neurologic exam normal and straight leg raise on the left is equivocal +45. Treatment options discussed, as well as risks, benefits, alternatives. Patient voiced understanding and agreement with the following plans: An After Visit Summary was printed and given to the patient. X-ray of lumbar spine normal. Urine test completely normal. No sign of kidney stone or urinary infection. Likely have a low back strain, but it's possible that you could've inflamed one of the nerves, causing some pain going down left leg. Today, we offered to give you a pain shot and also offered prednisone or muscle relaxant by mouth,but you declined. As we discussed, okay to take ibuprofen, up to 600 mg every 6-8 hours with food as needed for pain. Be sure to follow-up with your primary care provider in a week to recheck  Final Clinical Impressions(s) / UC Diagnoses   Final diagnoses:  Lumbar pain with radiation down left leg    ED Discharge Orders        Ordered    ibuprofen (ADVIL,MOTRIN) 200 MG tablet     07/16/17 1614       Controlled Substance Prescriptions [place] Controlled Substance Registry consulted? Not Applicable   Lajean Manes, MD 07/16/17 [phone]

## 2017-07-16 NOTE — ED Triage Notes (Signed)
Pt c/o severe lower back pain x1 month. Also has pain in L foot, describes pain like "walking on glass". Takes ibuprofen prn.

## 2017-08-05 ENCOUNTER — Ambulatory Visit: Payer: Medicare Other | Admitting: Physician Assistant

## 2017-08-05 DIAGNOSIS — Z0189 Encounter for other specified special examinations: Secondary | ICD-10-CM

## 2017-11-28 ENCOUNTER — Encounter: Payer: Self-pay | Admitting: Emergency Medicine

## 2017-11-28 ENCOUNTER — Emergency Department (INDEPENDENT_AMBULATORY_CARE_PROVIDER_SITE_OTHER)
Admission: EM | Admit: 2017-11-28 | Discharge: 2017-11-28 | Disposition: A | Discharge status: Home / Self Care | Payer: Medicare Other | Source: Home / Self Care | Attending: Family Medicine | Admitting: Family Medicine

## 2017-11-28 DIAGNOSIS — J039 Acute tonsillitis, unspecified: Secondary | ICD-10-CM | POA: Diagnosis not present

## 2017-11-28 DIAGNOSIS — Z0189 Encounter for other specified special examinations: Secondary | ICD-10-CM | POA: Diagnosis not present

## 2017-11-28 LAB — POCT RAPID STREP A (OFFICE): Rapid Strep A Screen: NEGATIVE

## 2017-11-28 MED ORDER — AMOXICILLIN 500 MG PO CAPS: 500.0000 mg | ORAL_CAPSULE | Freq: Two times a day (BID) | ORAL | 0 refills | Status: DC

## 2017-11-28 NOTE — ED Triage Notes (Signed)
Pt c/o sore throat and fever x3 days. Denies meds for this.

## 2017-11-28 NOTE — ED Provider Notes (Signed)
[facility]    CSN: [id number] Arrival date & time: 11/28/17  0102     History   Chief Complaint Chief Complaint  Patient presents with  . Sore Throat    HPI Austin Gill is a 32 y.o. male.   HPI Austin Gill is a 32 y.o. male presenting to UC with c/o sudden onset worsening throat pain that started 3 days ago, associated persistent fever Tmax 102*F.  Denies cough congestion but did have vomiting last night x 1.  He has not taken any medication today.  No known sick contacts. He is able to keep down fluids. No trouble breathing.   Past Medical History:  Diagnosis Date  . Anxiety   . Depression   . GERD (gastroesophageal reflux disease)   . Stutter     Patient Active Problem List   Diagnosis Date Noted  . Acute midline low back pain without sciatica 06/06/2017  . Erectile dysfunction 06/06/2017  . Left testicular pain 05/30/2016  . Testicular nodule 05/30/2016  . Overweight (BMI 25.0-29.9) 05/30/2016  . Facial lesion 11/17/2014  . Adjustment disorder with mixed anxiety and depressed mood 11/23/2013  . Social anxiety disorder 11/23/2013  . Dysthymia 11/23/2013  . Essential tremor 09/28/2013  . Fatty liver 12/07/2012  . Sphenoidal sinus polyp 12/07/2012  . Anxiety and depression 11/10/2012  . GERD (gastroesophageal reflux disease) 11/10/2012  . Stutter 11/10/2012    History reviewed. No pertinent surgical history.     Home Medications    Prior to Admission medications   Medication Sig Start Date End Date Taking? Authorizing Provider  amoxicillin (AMOXIL) 500 MG capsule Take 1 capsule (500 mg total) by mouth 2 (two) times daily. 11/28/17   Lurene Shadow, PA-C  ibuprofen (ADVIL,MOTRIN) 200 MG tablet Take three tablets ( 600 milligrams total) every 6 with food as needed for pain. 07/16/17   Lajean Manes, MD  ipratropium (ATROVENT) 0.06 % nasal spray Place 2 sprays into both nostrils 4 (four) times daily as needed. 06/06/17   Carlis Stable, PA-C  omeprazole (PRILOSEC) 40 MG capsule Take 40 mg by mouth daily.    [provider]  sildenafil (REVATIO) 20 MG tablet 1-5 tablets PO prn 30-60 minutes before sexual activity 06/06/17   Carlis Stable, PA-C    Family History Family History  Problem Relation Age of Onset  . Benign prostatic hyperplasia Father   . Parkinson's disease Father   . Prostate cancer Paternal Grandfather   . Prostate cancer Paternal Uncle     Social History Social History   Tobacco Use  . Smoking status: Never Smoker  . Smokeless tobacco: Never Used  Substance Use Topics  . Alcohol use: No    Frequency: Never  . Drug use: No     Allergies   Buspar [buspirone] and Paxil [paroxetine hcl]   Review of Systems Review of Systems  Constitutional: Positive for chills and fever.  HENT: Positive for sore throat. Negative for congestion, ear pain, trouble swallowing and voice change.   Respiratory: Negative for cough and shortness of breath.   Cardiovascular: Negative for chest pain and palpitations.  Gastrointestinal: Positive for nausea and vomiting. Negative for abdominal pain and diarrhea.  Musculoskeletal: Negative for arthralgias, back pain and myalgias.  Skin: Negative for rash.  Neurological: Positive for headaches.     Physical Exam Triage Vital Signs ED Triage Vitals  Enc Vitals Group     BP 11/28/17 1026 115/74     Pulse  Rate 11/28/17 1026 78     Resp --      Temp 11/28/17 1026 98.4 F (36.9 C)     Temp Source 11/28/17 1026 Oral     SpO2 11/28/17 1026 97 %     Weight 11/28/17 1027 168 lb (76.2 kg)     Height --      Head Circumference --      Peak Flow --      Pain Score --      Pain Loc --      Pain Edu? --      Excl. in GC? --    No data found.  Updated Vital Signs BP 115/74 (BP Location: Right Arm)   Pulse 78   Temp 98.4 F (36.9 C) (Oral)   Wt 168 lb (76.2 kg)   SpO2 97%   BMI 25.54 kg/m   Visual Acuity Right Eye Distance:     Left Eye Distance:   Bilateral Distance:    Right Eye Near:   Left Eye Near:    Bilateral Near:     Physical Exam  Constitutional: He is oriented to person, place, and time. He appears well-developed and well-nourished.  Non-toxic appearance. He does not appear ill. No distress.  HENT:  Head: Normocephalic and atraumatic.  Right Ear: Tympanic membrane normal.  Left Ear: Tympanic membrane normal.  Nose: Nose normal. Right sinus exhibits no maxillary sinus tenderness and no frontal sinus tenderness. Left sinus exhibits no maxillary sinus tenderness and no frontal sinus tenderness.  Mouth/Throat: Uvula is midline and mucous membranes are normal. Oropharyngeal exudate, posterior oropharyngeal edema and posterior oropharyngeal erythema present. No tonsillar abscesses.  Eyes: EOM are normal.  Neck: Normal range of motion. Neck supple.  Cardiovascular: Normal rate and regular rhythm.  Pulmonary/Chest: Effort normal and breath sounds normal. No stridor. No respiratory distress. He has no wheezes. He has no rhonchi.  Musculoskeletal: Normal range of motion.  Lymphadenopathy:    He has cervical adenopathy.  Neurological: He is alert and oriented to person, place, and time.  Skin: Skin is warm and dry.  Psychiatric: He has a normal mood and affect. His behavior is normal.  Nursing note and vitals reviewed.    UC Treatments / Results  Labs (all labs ordered are listed, but only abnormal results are displayed) Labs Reviewed  STREP A DNA PROBE  POCT RAPID STREP A (OFFICE)    EKG None  Radiology No results found.  Procedures Procedures (including critical care time)  Medications Ordered in UC Medications - No data to display  Initial Impression / Assessment and Plan / UC Course  I have reviewed the triage vital signs and the nursing notes.  Pertinent labs & imaging results that were available during my care of the patient were reviewed by me and considered in my medical  decision making (see chart for details).     Rapid strep: NEGATIVE Culture sent Centor score: 4  Will start on amoxicillin while pending given exam findings.  Final Clinical Impressions(s) / UC Diagnoses   Final diagnoses:  Exudative tonsillitis     Discharge Instructions      You may take 500mg  acetaminophen every 4-6 hours or in combination with ibuprofen 400-600mg  every 6-8 hours as needed for pain, inflammation, and fever.  Please follow up with family medicine in 5-7 days if not improving, sooner if worsening.      ED Prescriptions    Medication Sig Dispense Auth. Provider   amoxicillin (AMOXIL) 500  MG capsule Take 1 capsule (500 mg total) by mouth 2 (two) times daily. 20 capsule Lurene Shadow, PA-C     Controlled Substance Prescriptions [place] Controlled Substance Registry consulted? Not Applicable   Rolla Plate 11/28/17 1049

## 2017-11-28 NOTE — Discharge Instructions (Signed)
°  You may take 500mg  acetaminophen every 4-6 hours or in combination with ibuprofen 400-600mg  every 6-8 hours as needed for pain, inflammation, and fever.  Please follow up with family medicine in 5-7 days if not improving, sooner if worsening.

## 2017-11-29 ENCOUNTER — Telehealth: Payer: Self-pay | Admitting: Emergency Medicine

## 2017-11-29 LAB — STREP A DNA PROBE: Group A Strep Probe: NOT DETECTED

## 2017-11-29 NOTE — Telephone Encounter (Signed)
Contacted patient no answer left a message. To return a call if needed. Strep test negative.

## 2017-12-04 IMAGING — US US ART/VEN ABD/PELV/SCROTUM DOPPLER LTD
1 series · 14 of 25 positions shown · non-contrast
Comparison: None.

CLINICAL DATA: Left testicular pain 5 days and right testicular
pain beginning today. History of left epididymitis Tuesday January, 2016.

EXAM:
SCROTAL ULTRASOUND
DOPPLER ULTRASOUND OF THE TESTICLES
TECHNIQUE: Complete ultrasound examination of the testicles, epididymis, and
other scrotal structures was performed. Color and spectral Doppler
ultrasound were also utilized to evaluate blood flow to the
testicles.

[Series 1: us art/ven abd/pelv/scrotum doppler ltd · 0.07mm/px · 14 of 44 slices shown]
[im 1/44]
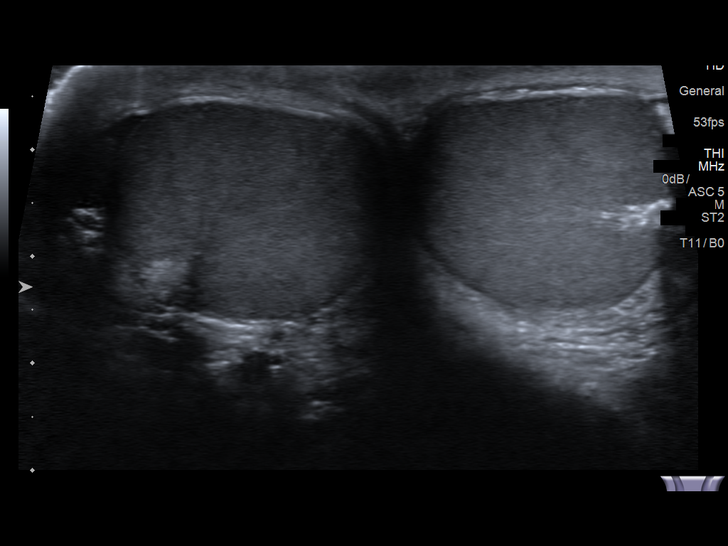
[im 4/44]
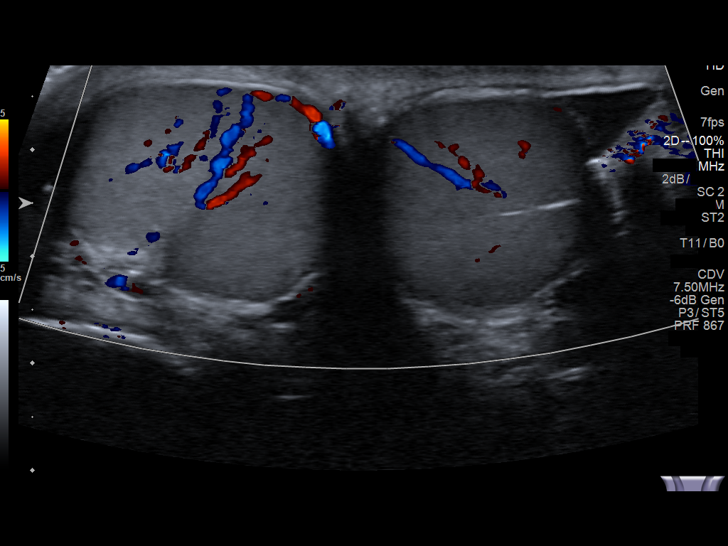
[im 8/44]
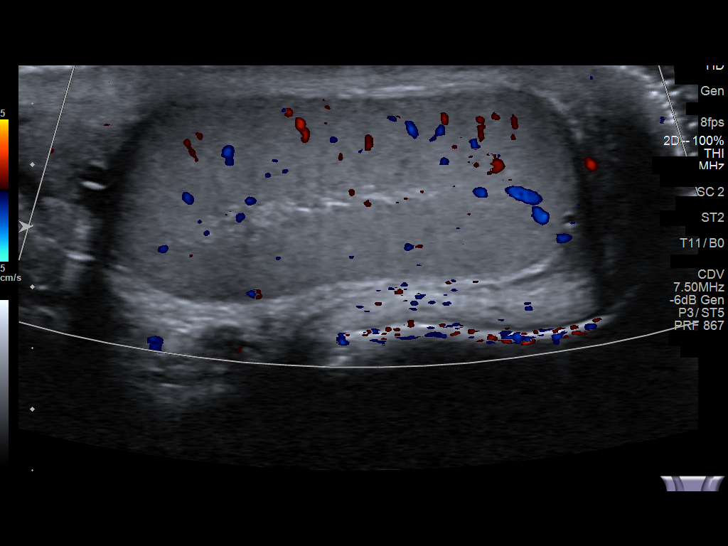
[im 11/44]
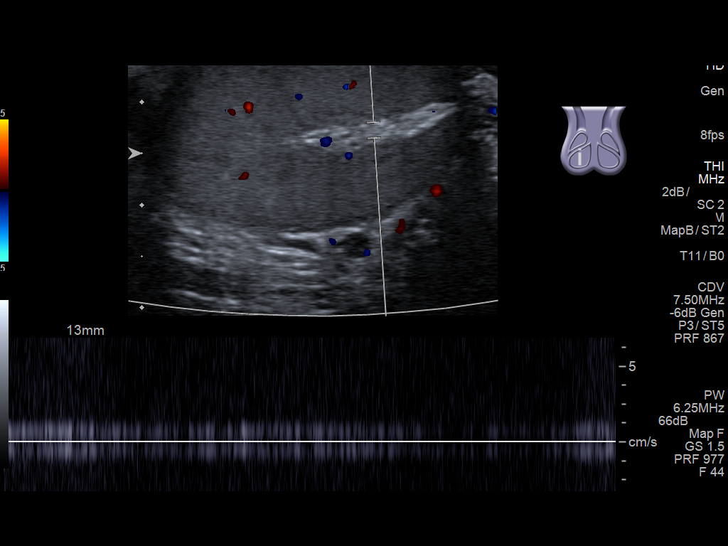
[im 15/44]
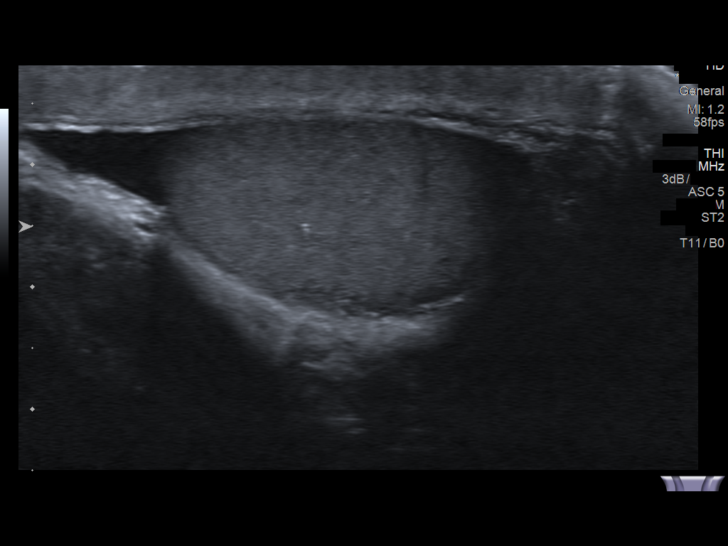
[im 17/44]
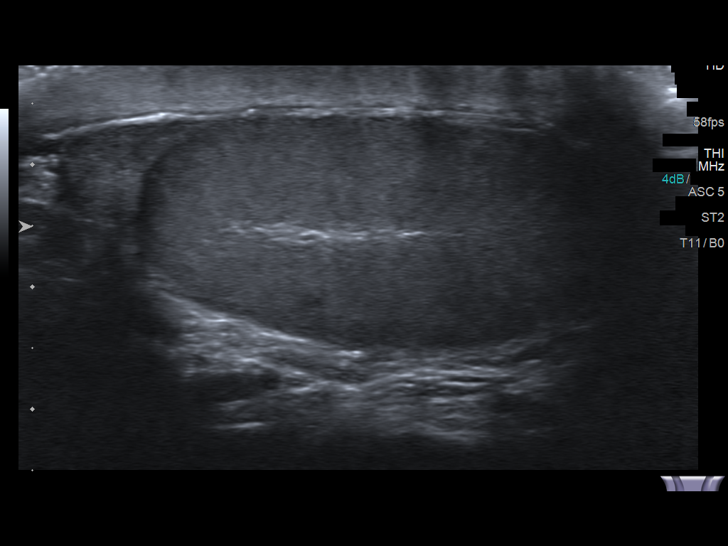
[im 20/44]
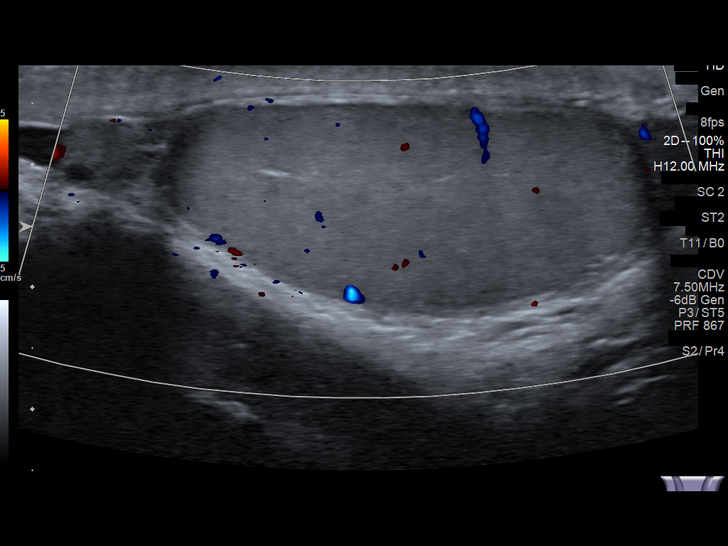
[im 24/44]
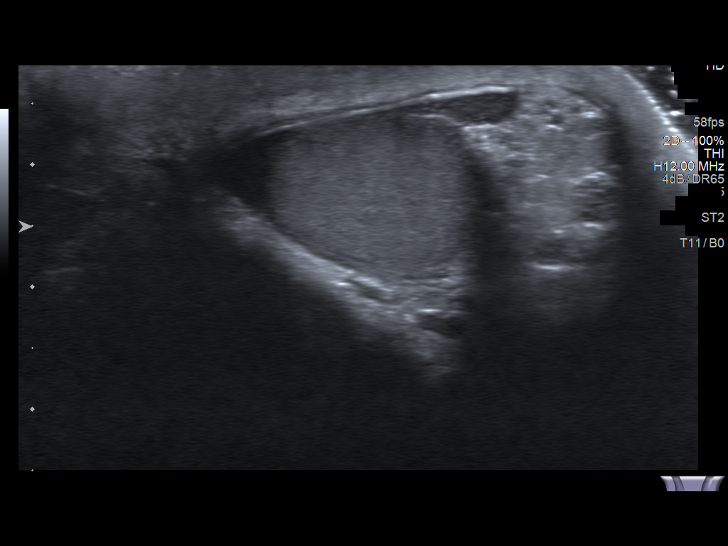
[im 27/44]
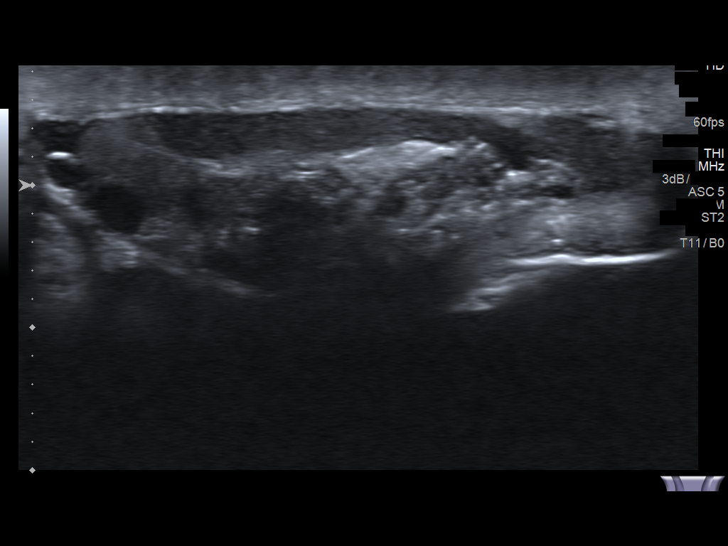
[im 29/44]
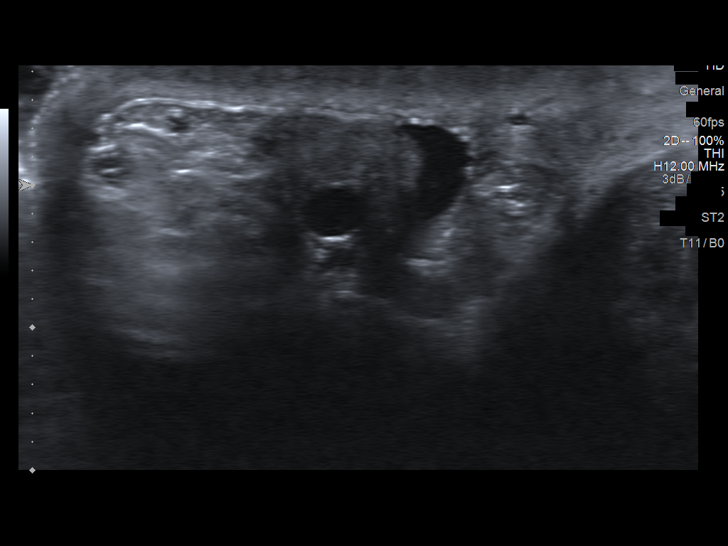
[im 33/44]
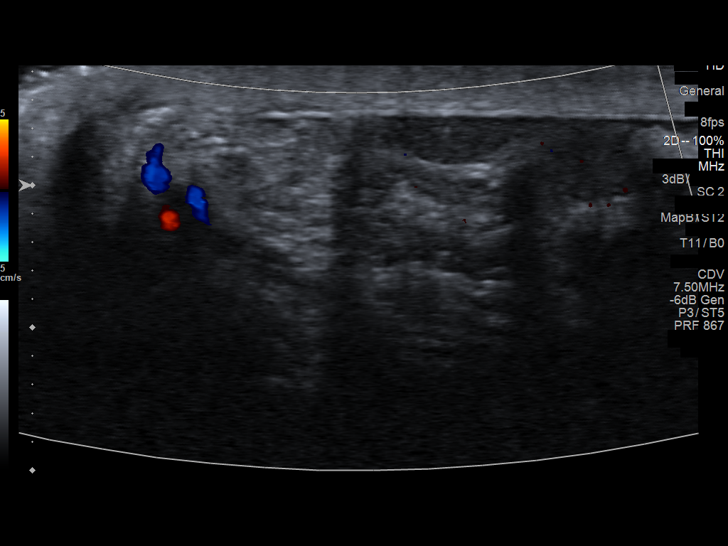
[im 36/44]
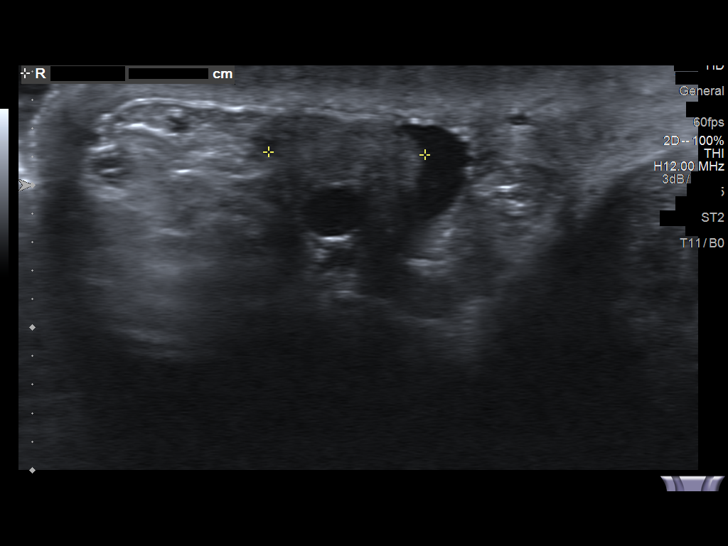
[im 40/44]
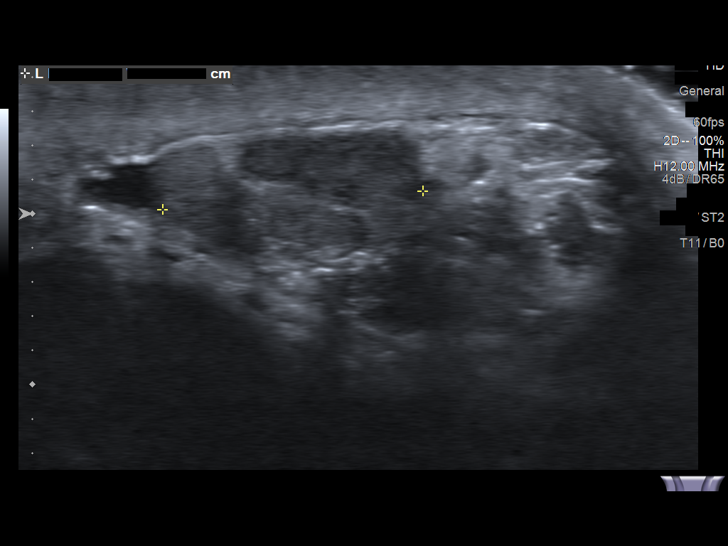
[im 44/44]
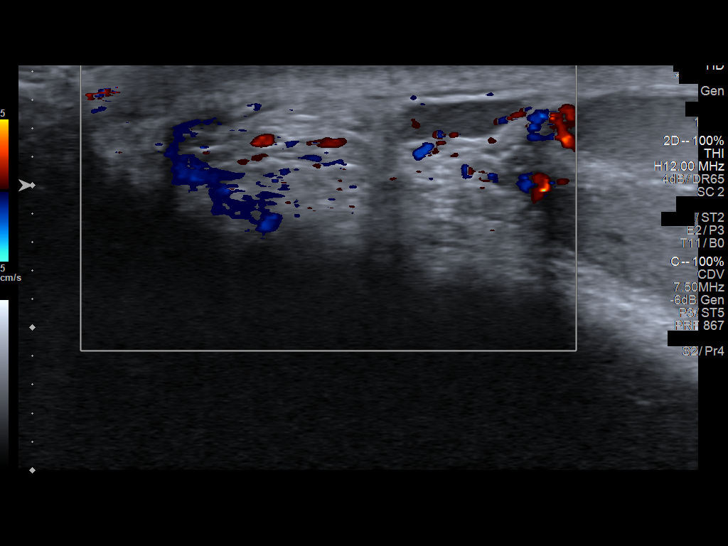

[14 of 25 positions shown; findings below may reference images not displayed]

FINDINGS: Right testicle

Measurements: 3.9 x 2.0 x 2.4 cm. No mass or microlithiasis
visualized.

Left testicle

Measurements: 3.9 x 1.9 x 2.5 cm. No mass or microlithiasis
visualized.

Right epididymis: 4 mm cyst versus spermatocele over the head of the
epididymis.

Left epididymis:  Normal in size and appearance.

Hydrocele:  Small bilateral hydroceles.

Varicocele:  None visualized.

Pulsed Doppler interrogation of both testes demonstrates normal low
resistance arterial and venous waveforms bilaterally.
IMPRESSION: Normal testicles without mass or torsion.

4 mm right epididymal cyst.

Small bilateral hydroceles.

## 2018-02-25 ENCOUNTER — Encounter: Payer: Medicare Other | Admitting: Physician Assistant

## 2018-02-25 DIAGNOSIS — Z202 Contact with and (suspected) exposure to infections with a predominantly sexual mode of transmission: Secondary | ICD-10-CM | POA: Diagnosis not present

## 2018-02-25 DIAGNOSIS — N489 Disorder of penis, unspecified: Secondary | ICD-10-CM | POA: Diagnosis not present

## 2018-07-24 ENCOUNTER — Emergency Department (INDEPENDENT_AMBULATORY_CARE_PROVIDER_SITE_OTHER)
Admission: EM | Admit: 2018-07-24 | Discharge: 2018-07-24 | Disposition: A | Discharge status: Home / Self Care | Payer: Medicare Other | Source: Home / Self Care

## 2018-07-24 ENCOUNTER — Other Ambulatory Visit: Payer: Self-pay

## 2018-07-24 ENCOUNTER — Encounter: Payer: Self-pay | Admitting: Emergency Medicine

## 2018-07-24 DIAGNOSIS — Z0189 Encounter for other specified special examinations: Secondary | ICD-10-CM | POA: Diagnosis not present

## 2018-07-24 DIAGNOSIS — Z202 Contact with and (suspected) exposure to infections with a predominantly sexual mode of transmission: Secondary | ICD-10-CM | POA: Diagnosis not present

## 2018-07-24 LAB — POCT URINALYSIS DIP (MANUAL ENTRY)
BILIRUBIN UA: NEGATIVE
Glucose, UA: NEGATIVE mg/dL
Ketones, POC UA: NEGATIVE mg/dL
Leukocytes, UA: NEGATIVE
Nitrite, UA: NEGATIVE
PH UA: 7 (ref 5.0–8.0)
Protein Ur, POC: NEGATIVE mg/dL
RBC UA: NEGATIVE
Spec Grav, UA: 1.02 (ref 1.010–1.025)
UROBILINOGEN UA: 0.2 U/dL

## 2018-07-24 MED ORDER — ONDANSETRON 4 MG PO TBDP
4.0000 mg | ORAL_TABLET | Freq: Once | ORAL | Status: AC
  Administered 2018-07-24: 4 mg via ORAL

## 2018-07-24 MED ORDER — CEFTRIAXONE SODIUM 250 MG IJ SOLR
250.0000 mg | Freq: Once | INTRAMUSCULAR | Status: AC
  Administered 2018-07-24: 250 mg via INTRAMUSCULAR

## 2018-07-24 MED ORDER — AZITHROMYCIN 250 MG PO TABS: 1000.0000 mg | ORAL_TABLET | Freq: Every day | ORAL | 0 refills | Status: DC

## 2018-07-24 NOTE — ED Provider Notes (Signed)
[facility]    CSN: [id number] Arrival date & time: 07/24/18  1148     History   Chief Complaint Chief Complaint  Patient presents with  . Dysuria  . Exposure to STD    HPI Austin Gill is a 33 y.o. male.   Patient reports unprotected sex with partner who notified him she had positive lab test; he is having dysuria. He has not had the influenza vacc this season; he has not travelled during past 3-4 weeks.  Patient last had intercourse without protection on Monday.  He was told Tuesday by his partner that she had chlamydia.  Patient developed dysuria about 3 days ago along with a watery discharge after voiding.  He has had no skin lesions, sore throat, or joint pain.  He also denies any fever.     Past Medical History:  Diagnosis Date  . Anxiety   . Depression   . GERD (gastroesophageal reflux disease)   . Stutter     Patient Active Problem List   Diagnosis Date Noted  . Acute midline low back pain without sciatica 06/06/2017  . Erectile dysfunction 06/06/2017  . Left testicular pain 05/30/2016  . Testicular nodule 05/30/2016  . Overweight (BMI 25.0-29.9) 05/30/2016  . Facial lesion 11/17/2014  . Adjustment disorder with mixed anxiety and depressed mood 11/23/2013  . Social anxiety disorder 11/23/2013  . Dysthymia 11/23/2013  . Essential tremor 09/28/2013  . Fatty liver 12/07/2012  . Sphenoidal sinus polyp 12/07/2012  . Anxiety and depression 11/10/2012  . GERD (gastroesophageal reflux disease) 11/10/2012  . Stutter 11/10/2012    History reviewed. No pertinent surgical history.     Home Medications    Prior to Admission medications   Medication Sig Start Date End Date Taking? Authorizing Provider  azithromycin (ZITHROMAX) 250 MG tablet Take 4 tablets (1,000 mg total) by mouth daily. Take the 4 tablets over 1 hour with plenty of water. 07/24/18   Elvina Sidle, MD  omeprazole (PRILOSEC) 40 MG capsule Take 40 mg by mouth daily.    [provider]    Family History Family History  Problem Relation Age of Onset  . Benign prostatic hyperplasia Father   . Parkinson's disease Father   . Prostate cancer Paternal Grandfather   . Prostate cancer Paternal Uncle     Social History Social History   Tobacco Use  . Smoking status: Never Smoker  . Smokeless tobacco: Never Used  Substance Use Topics  . Alcohol use: No    Frequency: Never  . Drug use: No     Allergies   Buspar [buspirone] and Paxil [paroxetine hcl]   Review of Systems Review of Systems  HENT: Negative for sore throat.   Genitourinary: Positive for discharge and dysuria.  Musculoskeletal: Negative for arthralgias.  All other systems reviewed and are negative.    Physical Exam Triage Vital Signs ED Triage Vitals  Enc Vitals Group     BP 07/24/18 1211 111/72     Pulse Rate 07/24/18 1211 78     Resp 07/24/18 1211 16     Temp 07/24/18 1211 98.8 F (37.1 C)     Temp Source 07/24/18 1211 Oral     SpO2 07/24/18 1211 98 %     Weight 07/24/18 1212 180 lb (81.6 kg)     Height 07/24/18 1212 5\' 8"  (1.727 m)     Head Circumference --      Peak Flow --      Pain Score  07/24/18 1212 1     Pain Loc --      Pain Edu? --      Excl. in GC? --    No data found.  Updated Vital Signs BP 111/72 (BP Location: Right Arm)   Pulse 78   Temp 98.8 F (37.1 C) (Oral)   Resp 16   Ht 5\' 8"  (1.727 m)   Wt 81.6 kg   SpO2 98%   BMI 27.37 kg/m    Physical Exam Vitals signs and nursing note reviewed.  Constitutional:      Appearance: Normal appearance.  HENT:     Mouth/Throat:     Mouth: Mucous membranes are moist.     Pharynx: Oropharynx is clear.  Eyes:     Conjunctiva/sclera: Conjunctivae normal.  Pulmonary:     Effort: Pulmonary effort is normal.  Abdominal:     General: Abdomen is flat.     Tenderness: There is no abdominal tenderness.  Genitourinary:    Penis: Normal.      Scrotum/Testes: Normal.  Musculoskeletal: Normal range of  motion.  Skin:    General: Skin is warm and dry.  Neurological:     General: No focal deficit present.     Mental Status: He is alert and oriented to person, place, and time.  Psychiatric:        Mood and Affect: Mood normal.        Thought Content: Thought content normal.      UC Treatments / Results  Labs (all labs ordered are listed, but only abnormal results are displayed) Labs Reviewed  POCT URINALYSIS DIP (MANUAL ENTRY)  URINE CYTOLOGY ANCILLARY ONLY    EKG None  Radiology No results found.  Procedures Procedures (including critical care time)  Medications Ordered in UC Medications  cefTRIAXone (ROCEPHIN) injection 250 mg (has no administration in time range)  ondansetron (ZOFRAN-ODT) disintegrating tablet 4 mg (4 mg Oral Given 07/24/18 1227)    Initial Impression / Assessment and Plan / UC Course  I have reviewed the triage vital signs and the nursing notes.  Pertinent labs & imaging results that were available during my care of the patient were reviewed by me and considered in my medical decision making (see chart for details).    Final Clinical Impressions(s) / UC Diagnoses   Final diagnoses:  Exposure to STD   Discharge Instructions   None    ED Prescriptions    Medication Sig Dispense Auth. Provider   azithromycin (ZITHROMAX) 250 MG tablet Take 4 tablets (1,000 mg total) by mouth daily. Take the 4 tablets over 1 hour with plenty of water. 6 tablet Elvina Sidle, MD     Controlled Substance Prescriptions [place] Controlled Substance Registry consulted? Not Applicable   Elvina Sidle, MD 07/24/18 1229

## 2018-07-24 NOTE — ED Triage Notes (Signed)
Patient reports unprotected sex with partner who notified him she had positive lab test; he is having dysuria. He has not had the influenza vacc this season; he has not travelled during past 3-4 weeks.

## 2018-07-25 LAB — C. TRACHOMATIS/N. GONORRHOEAE RNA
C. trachomatis RNA, TMA: NOT DETECTED
N. gonorrhoeae RNA, TMA: NOT DETECTED

## 2018-07-27 ENCOUNTER — Telehealth: Payer: Self-pay | Admitting: *Deleted

## 2018-07-27 NOTE — Telephone Encounter (Signed)
LM to call back for lab results.  

## 2018-07-28 ENCOUNTER — Telehealth: Payer: Self-pay | Admitting: Emergency Medicine

## 2018-07-28 NOTE — Telephone Encounter (Signed)
Labs normal, patient is feeling better

## 2018-08-12 ENCOUNTER — Telehealth: Payer: Medicare Other | Admitting: Family

## 2018-08-12 DIAGNOSIS — L739 Follicular disorder, unspecified: Secondary | ICD-10-CM

## 2018-08-12 MED ORDER — MUPIROCIN 2 % EX OINT: 1.0000 "application " | TOPICAL_OINTMENT | Freq: Two times a day (BID) | CUTANEOUS | 0 refills | Status: DC

## 2018-08-12 NOTE — Progress Notes (Signed)
E Visit for Rash  We are sorry that you are not feeling well. Here is how we plan to help!   Based upon what you have shared with me it looks like you have a bacterial follicultits.  Folliculitis is inflammation of the hair follicles that can be caused by a superficial infection of the skin and is treated with an antibiotic. I have prescribed: and Topical mupiricin.  Approximately 5 minutes was spent documenting and reviewing patient's chart.   HOME CARE:   Take cool showers and avoid direct sunlight.  Apply cool compress or wet dressings.  Take a bath in an oatmeal bath.  Sprinkle content of one Aveeno packet under running faucet with comfortably warm water.  Bathe for 15-20 minutes, 1-2 times daily.  Pat dry with a towel. Do not rub the rash.  Use hydrocortisone cream.  Take an antihistamine like Benadryl for widespread rashes that itch.  The adult dose of Benadryl is 25-50 mg by mouth 4 times daily.  Caution:  This type of medication may cause sleepiness.  Do not drink alcohol, drive, or operate dangerous machinery while taking antihistamines.  Do not take these medications if you have prostate enlargement.  Read package instructions thoroughly on all medications that you take.  GET HELP RIGHT AWAY IF:   Symptoms don't go away after treatment.  Severe itching that persists.  If you rash spreads or swells.  If you rash begins to smell.  If it blisters and opens or develops a yellow-brown crust.  You develop a fever.  You have a sore throat.  You become short of breath.  MAKE SURE YOU:  Understand these instructions. Will watch your condition. Will get help right away if you are not doing well or get worse.  Thank you for choosing an e-visit. Your e-visit answers were reviewed by a board certified advanced clinical practitioner to complete your personal care plan. Depending upon the condition, your plan could have included both over the counter or prescription  medications. Please review your pharmacy choice. Be sure that the pharmacy you have chosen is open so that you can pick up your prescription now.  If there is a problem you may message your provider in MyChart to have the prescription routed to another pharmacy. Your safety is important to us. If you have drug allergies check your prescription carefully.  For the next 24 hours, you can use MyChart to ask questions about today's visit, request a non-urgent call back, or ask for a work or school excuse from your e-visit provider. You will get an email in the next two days asking about your experience. I hope that your e-visit has been valuable and will speed your recovery.      

## 2018-08-31 ENCOUNTER — Telehealth: Payer: Self-pay

## 2018-08-31 NOTE — Telephone Encounter (Signed)
Austin Gill called and left a message for a return call. I called and left a message for patient to return our call.

## 2018-09-02 ENCOUNTER — Encounter: Payer: Self-pay | Admitting: Physician Assistant

## 2018-09-02 DIAGNOSIS — N529 Male erectile dysfunction, unspecified: Secondary | ICD-10-CM

## 2018-09-03 MED ORDER — SILDENAFIL CITRATE 20 MG PO TABS: 20.0000 mg | ORAL_TABLET | Freq: Once | ORAL | 3 refills | Status: DC | PRN

## 2018-10-26 ENCOUNTER — Encounter: Payer: Self-pay | Admitting: Physician Assistant

## 2018-10-27 ENCOUNTER — Encounter: Payer: Self-pay | Admitting: Physician Assistant

## 2018-10-27 ENCOUNTER — Ambulatory Visit (INDEPENDENT_AMBULATORY_CARE_PROVIDER_SITE_OTHER): Payer: Medicare Other | Admitting: Physician Assistant

## 2018-10-27 VITALS — Wt 183.0 lb

## 2018-10-27 DIAGNOSIS — G44029 Chronic cluster headache, not intractable: Secondary | ICD-10-CM | POA: Diagnosis not present

## 2018-10-27 DIAGNOSIS — N529 Male erectile dysfunction, unspecified: Secondary | ICD-10-CM

## 2018-10-27 MED ORDER — VERAPAMIL HCL ER 120 MG PO TBCR: 120.0000 mg | EXTENDED_RELEASE_TABLET | Freq: Every day | ORAL | 1 refills | Status: DC

## 2018-10-27 MED ORDER — SILDENAFIL CITRATE 20 MG PO TABS: 20.0000 mg | ORAL_TABLET | Freq: Once | ORAL | 3 refills | Status: DC | PRN

## 2018-10-27 MED ORDER — PROMETHAZINE HCL 12.5 MG PO TABS: 12.5000 mg | ORAL_TABLET | Freq: Four times a day (QID) | ORAL | 1 refills | Status: DC | PRN

## 2018-10-27 NOTE — Progress Notes (Signed)
Virtual Visit via Video Note  I connected with Austin Gill on 10/27/18 at  1:00 PM EDT by a video enabled telemedicine application and verified that I am speaking with the correct person using two identifiers.   I discussed the limitations of evaluation and management by telemedicine and the availability of in person appointments. The patient expressed understanding and agreed to proceed.  History of Present Illness: HPI:                                                                Austin Gill is a 33 y.o. male   CC: daily headaches  Onset: approx 2 years ago Reports sudden onset pain on right side of head in the parietal area rated as sharp, severe lasting for 5 minutes and resolving spontaneously. Describes headache as "like my head is going to explode." Associated with nausea and sometimes vomiting. Reports vomiting worsens his head pain Sometimes associated with right eye watering and flushing Hx of negative CTH and CTA 12/20/16 Mother has migraines  Reports he did have a headache lasting for a couple of days when he first took Sildenafil Requesting a refill of Sildenafil today. Reports 20 mg dose is effective for ED symptoms.    Past Medical History:  Diagnosis Date  . Anxiety   . Depression   . GERD (gastroesophageal reflux disease)   . Stutter    No past surgical history on file. Social History   Tobacco Use  . Smoking status: Never Smoker  . Smokeless tobacco: Never Used  Substance Use Topics  . Alcohol use: No    Frequency: Never   family history includes Benign prostatic hyperplasia in his father; Parkinson's disease in his father; Prostate cancer in his paternal grandfather and paternal uncle.    ROS: negative except as noted in the HPI  Medications: Current Outpatient Medications  Medication Sig Dispense Refill  . omeprazole (PRILOSEC) 40 MG capsule Take 40 mg by mouth daily.    . mupirocin ointment (BACTROBAN) 2 % Apply 1 application topically 2  (two) times daily. 22 g 0  . sildenafil (REVATIO) 20 MG tablet Take 1-5 tablets (20-100 mg total) by mouth once as needed for up to 1 dose (sexual activity). 50 tablet 3   No current facility-administered medications for this visit.    Allergies  Allergen Reactions  . Buspar [Buspirone]     Nausea  . Paxil [Paroxetine Hcl]     Suicidal thoughts       Objective:  Wt 183 lb (83 kg)   BMI 27.83 kg/m  Gen:  alert, not ill-appearing, no distress, appropriate for age HEENT: head normocephalic without obvious abnormality, conjunctiva and cornea clear, trachea midline Pulm: Normal work of breathing, normal phonation Neuro: alert and oriented x 3 Psych: cooperative, euthymic mood, affect mood-congruent, speech- slowed, stutter present; thought processes clear and goal-directed, normal judgment, good insight  BP Readings from Last 3 Encounters:  07/24/18 111/72  11/28/17 115/74  07/16/17 122/77    Acute Interface, Incoming Rad Results - 12/20/2016  7:00 PM EDT INDICATION:    Headache, acute, norm neuro exam,  COMPARISON:   None.  TECHNIQUE:    Multiple axial images obtained from the skull base to the vertex without IV contrast  were  obtained on  12/20/2016 6:41 PM  FINDINGS:  No evidence of hydrocephalus. No intracranial mass, mass effect or midline shift. No acute infarction evident. No intracranial hemorrhage. Paranasal sinuses: Clear. Mastoid air cells: Clear. Calvarium: Intact.   IMPRESSION: Negative head CT.  Electronically Signed by: Danella Deis  Assessment and Plan: 33 y.o. male with  .Meshulem was seen today for migraine.  Diagnoses and all orders for this visit:  Chronic cluster headache, not intractable -     Ambulatory referral to Neurology -     verapamil (CALAN-SR) 120 MG CR tablet; Take 1 tablet (120 mg total) by mouth at bedtime. -     promethazine (PHENERGAN) 12.5 MG tablet; Take 1 tablet (12.5 mg total) by mouth every 6 (six) hours as needed for  nausea or vomiting. Will cause drowsiness  Erectile dysfunction, unspecified erectile dysfunction type -     sildenafil (REVATIO) 20 MG tablet; Take 1-5 tablets (20-100 mg total) by mouth once as needed for up to 1 dose (sexual activity).  history is consistent with chronic cluster headache Neuro exam limited by video visit today. No red flag symptoms. Reassuring negative CTH/CTA in 2018 Start verapamil for prophylaxis Phenergan prn for nausea/vomiting Referral to Headache Clinic for eval and further management  Refilled Sildenafil I advised him to discontinue use if he has worsening/increased headaches as this is a common side effect    Follow Up Instructions:    I discussed the assessment and treatment plan with the patient. The patient was provided an opportunity to ask questions and all were answered. The patient agreed with the plan and demonstrated an understanding of the instructions.   The patient was advised to call back or seek an in-person evaluation if the symptoms worsen or if the condition fails to improve as anticipated.  I provided 15 minutes of non-face-to-face time during this encounter.   Trixie Dredge, [place]

## 2018-11-05 ENCOUNTER — Emergency Department (INDEPENDENT_AMBULATORY_CARE_PROVIDER_SITE_OTHER)
Admission: EM | Admit: 2018-11-05 | Discharge: 2018-11-05 | Disposition: A | Discharge status: Home / Self Care | Payer: Medicare Other | Source: Home / Self Care

## 2018-11-05 ENCOUNTER — Other Ambulatory Visit: Payer: Self-pay

## 2018-11-05 DIAGNOSIS — B880 Other acariasis: Secondary | ICD-10-CM | POA: Diagnosis not present

## 2018-11-05 MED ORDER — TRIAMCINOLONE ACETONIDE 0.1 % EX CREA: 1.0000 "application " | TOPICAL_CREAM | Freq: Two times a day (BID) | CUTANEOUS | 0 refills | Status: DC

## 2018-11-05 MED ORDER — PREDNISONE 50 MG PO TABS: 50.0000 mg | ORAL_TABLET | Freq: Every day | ORAL | 0 refills | Status: AC

## 2018-11-05 NOTE — ED Provider Notes (Signed)
[name]    CSN: [id number] Arrival date & time: 11/05/18  1215     History   Chief Complaint Chief Complaint  Patient presents with  . Rash    HPI Austin Gill is a 33 y.o. male.   HPI  Austin Gill is a 33 y.o. male presenting to UC with c/o 2 days worsening red itchy bumps on his lower legs and a few spots on his forearms. He has tried benadryl, cortisone cream and insect repellant w/o relief. He denies walking in grass or brush but notes he stands on a cement slab on his back porch almost daily. He does have 2 dogs but has not seen any fleas or ticks on them. Denies other symptoms. No new soaps, lotions or medications. No contact with others with a rash.   Past Medical History:  Diagnosis Date  . Anxiety   . Cluster headaches   . Depression   . GERD (gastroesophageal reflux disease)   . Stutter     Patient Active Problem List   Diagnosis Date Noted  . Acute midline low back pain without sciatica 06/06/2017  . Erectile dysfunction 06/06/2017  . Left testicular pain 05/30/2016  . Testicular nodule 05/30/2016  . Overweight (BMI 25.0-29.9) 05/30/2016  . Facial lesion 11/17/2014  . Adjustment disorder with mixed anxiety and depressed mood 11/23/2013  . Social anxiety disorder 11/23/2013  . Dysthymia 11/23/2013  . Essential tremor 09/28/2013  . Fatty liver 12/07/2012  . Sphenoidal sinus polyp 12/07/2012  . Anxiety and depression 11/10/2012  . GERD (gastroesophageal reflux disease) 11/10/2012  . Stutter 11/10/2012    Past Surgical History:  Procedure Laterality Date  . NO PAST SURGERIES         Home Medications    Prior to Admission medications   Medication Sig Start Date End Date Taking? Authorizing Provider  omeprazole (PRILOSEC) 40 MG capsule Take 40 mg by mouth daily.    [provider]  predniSONE (DELTASONE) 50 MG tablet Take 1 tablet (50 mg total) by mouth daily with breakfast for 5 days. 11/05/18 11/10/18  Noe Gens, PA-C   promethazine (PHENERGAN) 12.5 MG tablet Take 1 tablet (12.5 mg total) by mouth every 6 (six) hours as needed for nausea or vomiting. Will cause drowsiness 10/27/18   Trixie Dredge, PA-C  sildenafil (REVATIO) 20 MG tablet Take 1-5 tablets (20-100 mg total) by mouth once as needed for up to 1 dose (sexual activity). 10/27/18   Trixie Dredge, PA-C  triamcinolone cream (KENALOG) 0.1 % Apply 1 application topically 2 (two) times daily. 11/05/18   Noe Gens, PA-C  verapamil (CALAN-SR) 120 MG CR tablet Take 1 tablet (120 mg total) by mouth at bedtime. 10/27/18   Trixie Dredge, PA-C    Family History Family History  Problem Relation Age of Onset  . Benign prostatic hyperplasia Father   . Parkinson's disease Father   . Prostate cancer Paternal Grandfather   . Prostate cancer Paternal Uncle   . Migraines Mother     Social History Social History   Tobacco Use  . Smoking status: Never Smoker  . Smokeless tobacco: Never Used  Substance Use Topics  . Alcohol use: No    Frequency: Never  . Drug use: No     Allergies   Buspar [buspirone] and Paxil [paroxetine hcl]   Review of Systems Review of Systems  Musculoskeletal: Negative for arthralgias, joint swelling and myalgias.  Skin: Positive for rash. Negative for  wound.     Physical Exam Triage Vital Signs ED Triage Vitals  Enc Vitals Group     BP 11/05/18 1254 111/72     Pulse Rate 11/05/18 1254 83     Resp 11/05/18 1254 20     Temp 11/05/18 1254 98.6 F (37 C)     Temp Source 11/05/18 1254 Oral     SpO2 11/05/18 1254 95 %     Weight 11/05/18 1256 183 lb (83 kg)     Height 11/05/18 1256 5\' 7"  (1.702 m)     Head Circumference --      Peak Flow --      Pain Score 11/05/18 1255 2     Pain Loc --      Pain Edu? --      Excl. in GC? --    No data found.  Updated Vital Signs BP 111/72 (BP Location: Right Arm)   Pulse 83   Temp 98.6 F (37 C) (Oral)   Resp 20   Ht 5\' 7"  (1.702 m)    Wt 183 lb (83 kg)   SpO2 95%   BMI 28.66 kg/m   Visual Acuity Right Eye Distance:   Left Eye Distance:   Bilateral Distance:    Right Eye Near:   Left Eye Near:    Bilateral Near:     Physical Exam Vitals signs and nursing note reviewed.  Constitutional:      Appearance: Normal appearance. He is well-developed.  HENT:     Head: Normocephalic and atraumatic.  Neck:     Musculoskeletal: Normal range of motion.  Cardiovascular:     Rate and Rhythm: Normal rate.  Pulmonary:     Effort: Pulmonary effort is normal.  Musculoskeletal: Normal range of motion.  Skin:    General: Skin is warm and dry.     Capillary Refill: Capillary refill takes less than 2 seconds.     Findings: Erythema and rash present.     Comments: Diffuse erythematous papular rash on bilateral lower legs and sparse lesions on bilateral forearms. Rash is non-tender. No discharge.  Rash does not include hands or feet.  Neurological:     General: No focal deficit present.     Mental Status: He is alert and oriented to person, place, and time.  Psychiatric:        Behavior: Behavior normal.      UC Treatments / Results  Labs (all labs ordered are listed, but only abnormal results are displayed) Labs Reviewed - No data to display  EKG   Radiology No results found.  Procedures Procedures (including critical care time)  Medications Ordered in UC Medications - No data to display  Initial Impression / Assessment and Plan / UC Course  I have reviewed the triage vital signs and the nursing notes.  Pertinent labs & imaging results that were available during my care of the patient were reviewed by me and considered in my medical decision making (see chart for details).    Hx and exam most c/w chigger bites.  Offered steroid shot, pt declined but will try oral prednisone.    Final Clinical Impressions(s) / UC Diagnoses   Final diagnoses:  Chigger bites     Discharge Instructions      The  insect bites look similar to those of an insect called a Chigger.  You may gently clean your rash with warm water and mild soap. Pat dry the rash and apply the prescribed triamcinolone cream  to help with itching.  Please follow up with family medicine next week if not improving.     ED Prescriptions    Medication Sig Dispense Auth. Provider   predniSONE (DELTASONE) 50 MG tablet Take 1 tablet (50 mg total) by mouth daily with breakfast for 5 days. 5 tablet Waylan RocherPhelps, Evani Shrider O, PA-C   triamcinolone cream (KENALOG) 0.1 % Apply 1 application topically 2 (two) times daily. 30 g Lurene ShadowPhelps, Cedar Roseman O, PA-C     Controlled Substance Prescriptions [place] Controlled Substance Registry consulted? Not Applicable   Rolla Platehelps, Bj Morlock O, PA-C 11/05/18 1333

## 2018-11-05 NOTE — ED Triage Notes (Signed)
Pt has insect bites on legs, stops around knees.  On arm there are a few around the wrist.  Started about 2 days ago.  Has used benadryl, cortisone cream, and insect repellant.  Nothing has helped.

## 2018-11-05 NOTE — Discharge Instructions (Signed)
°  The insect bites look similar to those of an insect called a Chigger.  You may gently clean your rash with warm water and mild soap. Pat dry the rash and apply the prescribed triamcinolone cream to help with itching.  Please follow up with family medicine next week if not improving.

## 2019-01-13 ENCOUNTER — Other Ambulatory Visit: Payer: Self-pay

## 2019-01-13 ENCOUNTER — Emergency Department (INDEPENDENT_AMBULATORY_CARE_PROVIDER_SITE_OTHER)
Admission: EM | Admit: 2019-01-13 | Discharge: 2019-01-13 | Disposition: A | Discharge status: Home / Self Care | Payer: Medicare Other | Source: Home / Self Care

## 2019-01-13 DIAGNOSIS — B349 Viral infection, unspecified: Secondary | ICD-10-CM | POA: Diagnosis not present

## 2019-01-13 LAB — POCT CBC W AUTO DIFF (K'VILLE URGENT CARE)

## 2019-01-13 NOTE — ED Triage Notes (Signed)
For the last 9 days, has had chills at night time.  The last 2-3 days, it has been more during the day, along with body aches, and headaches.

## 2019-01-13 NOTE — Discharge Instructions (Addendum)
The blood test suggests a viral rather than a bacterial infection.  High on the list would be COVID-19  THE COVID TEST SHOULD BE BACK IN 2-5 DAYS.  In the meantime, self-isolate and treat with ibuprofen in the evening hours.  If symptoms worsen with shortness of breath or chest pain, return or go to the emergency room.

## 2019-01-13 NOTE — ED Provider Notes (Signed)
[name]    CSN: [id number] Arrival date & time: 01/13/19  1243      History   Chief Complaint Chief Complaint  Patient presents with  . Chills  . Generalized Body Aches  . Headache    HPI Austin Gill is a 33 y.o. male.   Established [facility] patient  For the last 9 days, has had chills at night time.  The last 2-3 days, it has been more during the day, along with body aches, and headaches.  Patient is unemployed now.  Other than going to stores, he has no known exposure to COVID-19.   Patient's initial symptoms began 9 days ago with a sore throat which lasted 2 days and resolved.  He has had increasingly productive cough over the last week or so.  He says he has a chronic cough from smoking but the amount of phlegm has increased.  Patient denies nausea, vomiting, loss of sense of smell.     Past Medical History:  Diagnosis Date  . Anxiety   . Cluster headaches   . Depression   . GERD (gastroesophageal reflux disease)   . Stutter     Patient Active Problem List   Diagnosis Date Noted  . Acute midline low back pain without sciatica 06/06/2017  . Erectile dysfunction 06/06/2017  . Left testicular pain 05/30/2016  . Testicular nodule 05/30/2016  . Overweight (BMI 25.0-29.9) 05/30/2016  . Facial lesion 11/17/2014  . Adjustment disorder with mixed anxiety and depressed mood 11/23/2013  . Social anxiety disorder 11/23/2013  . Dysthymia 11/23/2013  . Essential tremor 09/28/2013  . Fatty liver 12/07/2012  . Sphenoidal sinus polyp 12/07/2012  . Anxiety and depression 11/10/2012  . GERD (gastroesophageal reflux disease) 11/10/2012  . Stutter 11/10/2012    Past Surgical History:  Procedure Laterality Date  . NO PAST SURGERIES         Home Medications    Prior to Admission medications   Medication Sig Start Date End Date Taking? Authorizing Provider  omeprazole (PRILOSEC) 40 MG capsule Take 40 mg by mouth daily.    [provider]   promethazine (PHENERGAN) 12.5 MG tablet Take 1 tablet (12.5 mg total) by mouth every 6 (six) hours as needed for nausea or vomiting. Will cause drowsiness 10/27/18   Trixie Dredge, PA-C  sildenafil (REVATIO) 20 MG tablet Take 1-5 tablets (20-100 mg total) by mouth once as needed for up to 1 dose (sexual activity). 10/27/18   Trixie Dredge, PA-C  triamcinolone cream (KENALOG) 0.1 % Apply 1 application topically 2 (two) times daily. 11/05/18   Noe Gens, PA-C  verapamil (CALAN-SR) 120 MG CR tablet Take 1 tablet (120 mg total) by mouth at bedtime. 10/27/18   Trixie Dredge, PA-C    Family History Family History  Problem Relation Age of Onset  . Benign prostatic hyperplasia Father   . Parkinson's disease Father   . Prostate cancer Paternal Grandfather   . Prostate cancer Paternal Uncle   . Migraines Mother     Social History Social History   Tobacco Use  . Smoking status: Never Smoker  . Smokeless tobacco: Never Used  Substance Use Topics  . Alcohol use: No    Frequency: Never  . Drug use: No     Allergies   Buspar [buspirone] and Paxil [paroxetine hcl]   Review of Systems Review of Systems  Constitutional: Positive for chills, diaphoresis, fatigue and fever.  Respiratory: Positive for cough.   Musculoskeletal:  Positive for myalgias.  Neurological: Positive for headaches.  All other systems reviewed and are negative.    Physical Exam Triage Vital Signs ED Triage Vitals  Enc Vitals Group     BP 01/13/19 1321 131/77     Pulse Rate 01/13/19 1321 84     Resp 01/13/19 1321 20     Temp 01/13/19 1321 98.7 F (37.1 C)     Temp Source 01/13/19 1321 Oral     SpO2 01/13/19 1321 98 %     Weight 01/13/19 1323 180 lb (81.6 kg)     Height 01/13/19 1323 5\' 7"  (1.702 m)     Head Circumference --      Peak Flow --      Pain Score 01/13/19 1323 0     Pain Loc --      Pain Edu? --      Excl. in GC? --    No data found.  Updated Vital  Signs BP 131/77 (BP Location: Right Arm)   Pulse 84   Temp 98.7 F (37.1 C) (Oral) Comment: 100.5 last night  Resp 20   Ht 5\' 7"  (1.702 m)   Wt 81.6 kg   SpO2 98%   BMI 28.19 kg/m    Physical Exam Vitals signs and nursing note reviewed.  Constitutional:      Appearance: He is well-developed and normal weight.  HENT:     Head: Normocephalic.     Mouth/Throat:     Mouth: Mucous membranes are moist.     Pharynx: Oropharynx is clear.  Eyes:     Extraocular Movements: Extraocular movements intact.  Neck:     Musculoskeletal: Normal range of motion and neck supple.  Cardiovascular:     Rate and Rhythm: Normal rate and regular rhythm.     Heart sounds: Normal heart sounds.  Pulmonary:     Effort: Pulmonary effort is normal.     Breath sounds: Normal breath sounds.  Abdominal:     General: Bowel sounds are normal.  Musculoskeletal: Normal range of motion.  Lymphadenopathy:     Cervical: No cervical adenopathy.  Skin:    General: Skin is warm and dry.  Neurological:     Mental Status: He is alert.     Cranial Nerves: No cranial nerve deficit.     Sensory: No sensory deficit.     Motor: No weakness.  Psychiatric:        Mood and Affect: Mood normal.     Comments: Stuttering speech      UC Treatments / Results  Labs (all labs ordered are listed, but only abnormal results are displayed) Labs Reviewed  NOVEL CORONAVIRUS, NAA  POCT CBC W AUTO DIFF ([facility])    EKG   Radiology No results found.  Procedures Procedures (including critical care time)  Medications Ordered in UC Medications - No data to display  Initial Impression / Assessment and Plan / UC Course  I have reviewed the triage vital signs and the nursing notes.  Pertinent labs & imaging results that were available during my care of the patient were reviewed by me and considered in my medical decision making (see chart for details).    Final Clinical Impressions(s) / UC Diagnoses    Final diagnoses:  Acute viral syndrome     Discharge Instructions     The blood test suggests a viral rather than a bacterial infection.  High on the list would be COVID-19  THE COVID TEST SHOULD  BE BACK IN 2-5 DAYS.  In the meantime, self-isolate and treat with ibuprofen in the evening hours.  If symptoms worsen with shortness of breath or chest pain, return or go to the emergency room.    ED Prescriptions    None     Controlled Substance Prescriptions [place] Controlled Substance Registry consulted? Not Applicable   Elvina SidleLauenstein, Kamisha Ell, MD 01/13/19 1435

## 2019-01-14 ENCOUNTER — Encounter (HOSPITAL_COMMUNITY): Payer: Self-pay

## 2019-01-14 LAB — NOVEL CORONAVIRUS, NAA: SARS-CoV-2, NAA: NOT DETECTED

## 2019-03-04 ENCOUNTER — Emergency Department (INDEPENDENT_AMBULATORY_CARE_PROVIDER_SITE_OTHER)
Admission: EM | Admit: 2019-03-04 | Discharge: 2019-03-04 | Disposition: A | Discharge status: Home / Self Care | Payer: Medicare Other | Source: Home / Self Care | Attending: Emergency Medicine | Admitting: Emergency Medicine

## 2019-03-04 ENCOUNTER — Encounter: Payer: Self-pay | Admitting: *Deleted

## 2019-03-04 ENCOUNTER — Other Ambulatory Visit: Payer: Self-pay

## 2019-03-04 DIAGNOSIS — Z20822 Contact with and (suspected) exposure to covid-19: Secondary | ICD-10-CM

## 2019-03-04 DIAGNOSIS — Z20828 Contact with and (suspected) exposure to other viral communicable diseases: Secondary | ICD-10-CM

## 2019-03-04 NOTE — ED Triage Notes (Signed)
Pt is here today for a COVID test. Denies s/s. He was with someone last Thursday that tested positive for COVID on Friday.

## 2019-03-04 NOTE — ED Provider Notes (Signed)
[facility]    CSN: [id number] Arrival date & time: 03/04/19  0932      History   Chief Complaint No chief complaint on file.   HPI Austin Gill is a 33 y.o. male.   HPI Patient enters for Covid testing.  He had an exposure 5 days ago.  He was with the person for 10 minutes.  That person tested positive the next day but his wife tested negative in the same house.  Austin Gill has no symptoms he has no loss of taste smell no cough no fever and feels fine. Past Medical History:  Diagnosis Date  . Anxiety   . Cluster headaches   . Depression   . GERD (gastroesophageal reflux disease)   . Stutter     Patient Active Problem List   Diagnosis Date Noted  . Acute midline low back pain without sciatica 06/06/2017  . Erectile dysfunction 06/06/2017  . Left testicular pain 05/30/2016  . Testicular nodule 05/30/2016  . Overweight (BMI 25.0-29.9) 05/30/2016  . Facial lesion 11/17/2014  . Adjustment disorder with mixed anxiety and depressed mood 11/23/2013  . Social anxiety disorder 11/23/2013  . Dysthymia 11/23/2013  . Essential tremor 09/28/2013  . Fatty liver 12/07/2012  . Sphenoidal sinus polyp 12/07/2012  . Anxiety and depression 11/10/2012  . GERD (gastroesophageal reflux disease) 11/10/2012  . Stutter 11/10/2012    Past Surgical History:  Procedure Laterality Date  . NO PAST SURGERIES         Home Medications    Prior to Admission medications   Medication Sig Start Date End Date Taking? Authorizing Provider  omeprazole (PRILOSEC) 40 MG capsule Take 40 mg by mouth daily.    [provider]    Family History Family History  Problem Relation Age of Onset  . Benign prostatic hyperplasia Father   . Parkinson's disease Father   . Prostate cancer Paternal Grandfather   . Prostate cancer Paternal Uncle   . Migraines Mother     Social History Social History   Tobacco Use  . Smoking status: Never Smoker  . Smokeless tobacco: Never Used   Substance Use Topics  . Alcohol use: No    Frequency: Never  . Drug use: No     Allergies   Buspar [buspirone] and Paxil [paroxetine hcl]   Review of Systems Review of Systems  Constitutional: Negative.   HENT: Negative.   Eyes: Negative.   Respiratory: Negative.   Cardiovascular: Negative.   Gastrointestinal: Negative.   Musculoskeletal: Negative.      Physical Exam Triage Vital Signs ED Triage Vitals  Enc Vitals Group     BP 03/04/19 0946 115/73     Pulse Rate 03/04/19 0946 76     Resp 03/04/19 0946 16     Temp 03/04/19 0946 98.8 F (37.1 C)     Temp Source 03/04/19 0946 Oral     SpO2 03/04/19 0946 98 %     Weight 03/04/19 0947 172 lb (78 kg)     Height --      Head Circumference --      Peak Flow --      Pain Score 03/04/19 0947 0     Pain Loc --      Pain Edu? --      Excl. in GC? --    No data found.  Updated Vital Signs BP 115/73 (BP Location: Right Arm)   Pulse 76   Temp 98.8 F (37.1 C) (Oral)  Resp 16   Wt 78 kg   SpO2 98%   BMI 26.94 kg/m   Visual Acuity Right Eye Distance:   Left Eye Distance:   Bilateral Distance:    Right Eye Near:   Left Eye Near:    Bilateral Near:     Physical Exam Constitutional:      Appearance: Normal appearance.  Eyes:     Conjunctiva/sclera: Conjunctivae normal.  Cardiovascular:     Rate and Rhythm: Normal rate.  Pulmonary:     Effort: Pulmonary effort is normal.     Breath sounds: Normal breath sounds.  Neurological:     Mental Status: He is alert.      UC Treatments / Results  Labs (all labs ordered are listed, but only abnormal results are displayed) Labs Reviewed  NOVEL CORONAVIRUS, NAA    EKG   Radiology No results found.  Procedures Procedures (including critical care time)  Medications Ordered in UC Medications - No data to display  Initial Impression / Assessment and Plan / UC Course  I have reviewed the triage vital signs and the nursing notes. Patient is 5 days post  Covid exposure.  Covid testing was done and send out expect results in 48 to 72 hours.  He will self quarantine for 14 days post exposure I advised him not to take a flu shot until he was out of quarantine. Pertinent labs & imaging results that were available during my care of the patient were reviewed by me and considered in my medical decision making (see chart for details).      Final Clinical Impressions(s) / UC Diagnoses   Final diagnoses:  Close exposure to COVID-19 virus   Discharge Instructions   None    ED Prescriptions    None     PDMP not reviewed this encounter.   Darlyne Russian, MD 03/05/19 1052

## 2019-03-04 NOTE — Discharge Instructions (Signed)
A Covid test was done. I have given you information about Covid. Please self quarantine for 14 days. If you develop difficulty breathing with chest pain, shortness of breath please present to the emergency room.

## 2019-03-05 LAB — NOVEL CORONAVIRUS, NAA: SARS-CoV-2, NAA: NOT DETECTED

## 2019-03-21 ENCOUNTER — Other Ambulatory Visit: Payer: Self-pay

## 2019-03-21 ENCOUNTER — Encounter: Payer: Self-pay | Admitting: Emergency Medicine

## 2019-03-21 ENCOUNTER — Emergency Department (INDEPENDENT_AMBULATORY_CARE_PROVIDER_SITE_OTHER)
Admission: EM | Admit: 2019-03-21 | Discharge: 2019-03-21 | Disposition: A | Discharge status: Home / Self Care | Payer: Medicare Other | Source: Home / Self Care

## 2019-03-21 DIAGNOSIS — R198 Other specified symptoms and signs involving the digestive system and abdomen: Secondary | ICD-10-CM | POA: Diagnosis not present

## 2019-03-21 DIAGNOSIS — R300 Dysuria: Secondary | ICD-10-CM | POA: Diagnosis not present

## 2019-03-21 DIAGNOSIS — Z87438 Personal history of other diseases of male genital organs: Secondary | ICD-10-CM

## 2019-03-21 LAB — POCT URINALYSIS DIP (MANUAL ENTRY)
Bilirubin, UA: NEGATIVE
Blood, UA: NEGATIVE
Glucose, UA: NEGATIVE mg/dL
Ketones, POC UA: NEGATIVE mg/dL
Leukocytes, UA: NEGATIVE
Nitrite, UA: NEGATIVE
Protein Ur, POC: NEGATIVE mg/dL
Spec Grav, UA: 1.02 (ref 1.010–1.025)
Urobilinogen, UA: 0.2 E.U./dL
pH, UA: 7.5 (ref 5.0–8.0)

## 2019-03-21 LAB — POCT CBC W AUTO DIFF (K'VILLE URGENT CARE)

## 2019-03-21 LAB — UNLABELED: Test Ordered On Req: 5363

## 2019-03-21 MED ORDER — SULFAMETHOXAZOLE-TRIMETHOPRIM 800-160 MG PO TABS: 1.0000 | ORAL_TABLET | Freq: Two times a day (BID) | ORAL | 0 refills | Status: DC

## 2019-03-21 NOTE — ED Triage Notes (Signed)
Here with c/o rectal pressure and urine freq/urge x3 days. States, "it feels like something is poking me on inside of rectum." Denies rectal bleeding or bump, penile d/c or burning sensation.

## 2019-03-21 NOTE — ED Provider Notes (Signed)
[facility]    CSN: [id number] Arrival date & time: 03/21/19  1352      History   Chief Complaint Chief Complaint  Patient presents with  . Rectal Problems    HPI Austin Gill is a 33 y.o. male.   HPI Austin Gill is a 33 y.o. male presenting to UC with c/o 3 days of urinary frequency and urgency with associated rectal pressure.  He states "it feels like something is poking me on the inside of rectum."  He has had prostatitis in the past. Symptoms of frequency are similar.  He has not felt any hemorrhoids and denies blood on stool or tissue paper while wiping.  Denies fever, chills, n/v/d.    Past Medical History:  Diagnosis Date  . Anxiety   . Cluster headaches   . Depression   . GERD (gastroesophageal reflux disease)   . Stutter     Patient Active Problem List   Diagnosis Date Noted  . Acute midline low back pain without sciatica 06/06/2017  . Erectile dysfunction 06/06/2017  . Left testicular pain 05/30/2016  . Testicular nodule 05/30/2016  . Overweight (BMI 25.0-29.9) 05/30/2016  . Facial lesion 11/17/2014  . Adjustment disorder with mixed anxiety and depressed mood 11/23/2013  . Social anxiety disorder 11/23/2013  . Dysthymia 11/23/2013  . Essential tremor 09/28/2013  . Fatty liver 12/07/2012  . Sphenoidal sinus polyp 12/07/2012  . Anxiety and depression 11/10/2012  . GERD (gastroesophageal reflux disease) 11/10/2012  . Stutter 11/10/2012    Past Surgical History:  Procedure Laterality Date  . NO PAST SURGERIES         Home Medications    Prior to Admission medications   Medication Sig Start Date End Date Taking? Authorizing Provider  omeprazole (PRILOSEC) 40 MG capsule Take 40 mg by mouth daily.    [provider]  sulfamethoxazole-trimethoprim (BACTRIM DS) 800-160 MG tablet Take 1 tablet by mouth 2 (two) times daily for 10 days. 03/21/19 03/31/19  Noe Gens, PA-C    Family History Family History  Problem Relation  Age of Onset  . Benign prostatic hyperplasia Father   . Parkinson's disease Father   . Prostate cancer Paternal Grandfather   . Prostate cancer Paternal Uncle   . Migraines Mother     Social History Social History   Tobacco Use  . Smoking status: Never Smoker  . Smokeless tobacco: Never Used  Substance Use Topics  . Alcohol use: No    Frequency: Never  . Drug use: No     Allergies   Buspar [buspirone] and Paxil [paroxetine hcl]   Review of Systems Review of Systems  Constitutional: Negative for chills and fever.  Gastrointestinal: Positive for rectal pain ( pressure). Negative for abdominal pain, diarrhea, nausea and vomiting.  Genitourinary: Positive for frequency and urgency. Negative for decreased urine volume, discharge, dysuria, flank pain, hematuria, penile pain and testicular pain.  Musculoskeletal: Negative for back pain.     Physical Exam Triage Vital Signs ED Triage Vitals  Enc Vitals Group     BP 03/21/19 1418 119/72     Pulse Rate 03/21/19 1418 88     Resp --      Temp 03/21/19 1418 98.3 F (36.8 C)     Temp Source 03/21/19 1418 Oral     SpO2 03/21/19 1418 97 %     Weight 03/21/19 1421 169 lb (76.7 kg)     Height 03/21/19 1421 5\' 7"  (1.702 m)  Head Circumference --      Peak Flow --      Pain Score 03/21/19 1418 5     Pain Loc --      Pain Edu? --      Excl. in GC? --    No data found.  Updated Vital Signs BP 119/72 (BP Location: Left Arm)   Pulse 88   Temp 98.3 F (36.8 C) (Oral)   Ht 5\' 7"  (1.702 m)   Wt 169 lb (76.7 kg)   SpO2 97%   BMI 26.47 kg/m   Visual Acuity Right Eye Distance:   Left Eye Distance:   Bilateral Distance:    Right Eye Near:   Left Eye Near:    Bilateral Near:     Physical Exam Vitals signs and nursing note reviewed. Exam conducted with a chaperone present.  Constitutional:      Appearance: Normal appearance. He is well-developed.  HENT:     Head: Normocephalic and atraumatic.  Neck:      Musculoskeletal: Normal range of motion.  Cardiovascular:     Rate and Rhythm: Normal rate and regular rhythm.  Pulmonary:     Effort: Pulmonary effort is normal. No respiratory distress.     Breath sounds: Normal breath sounds.  Abdominal:     General: There is no distension.     Palpations: Abdomen is soft.     Tenderness: There is no abdominal tenderness. There is no right CVA tenderness or left CVA tenderness.  Genitourinary:    Prostate: Enlarged and tender.     Rectum: Tenderness (mild) and internal hemorrhoid (possible vs enlarged prostate. ) present. No anal fissure or external hemorrhoid. Normal anal tone.  Musculoskeletal: Normal range of motion.  Skin:    General: Skin is warm and dry.  Neurological:     Mental Status: He is alert and oriented to person, place, and time.  Psychiatric:        Behavior: Behavior normal.      UC Treatments / Results  Labs (all labs ordered are listed, but only abnormal results are displayed) Labs Reviewed  PSA  POCT URINALYSIS DIP (MANUAL ENTRY)  POCT CBC W AUTO DIFF ([facility])    EKG   Radiology No results found.  Procedures Procedures (including critical care time)  Medications Ordered in UC Medications - No data to display  Initial Impression / Assessment and Plan / UC Course  I have reviewed the triage vital signs and the nursing notes.  Pertinent labs & imaging results that were available during my care of the patient were reviewed by me and considered in my medical decision making (see chart for details).     Hx and exam most c/w prostatitis Will start on bactrim and encouraged f/u with PCP or urology next week for recheck of symptoms. AVS provided.  Final Clinical Impressions(s) / UC Diagnoses   Final diagnoses:  Dysuria  History of prostatitis  Rectal pressure   Discharge Instructions   None    ED Prescriptions    Medication Sig Dispense Auth. Provider   sulfamethoxazole-trimethoprim  (BACTRIM DS) 800-160 MG tablet Take 1 tablet by mouth 2 (two) times daily for 10 days. 20 tablet , Lurene Shadow     PDMP not reviewed this encounter.   [place], PA-C 03/21/19 1443

## 2019-03-21 NOTE — Discharge Instructions (Signed)
°  Please take antibiotics as prescribed and be sure to complete entire course even if you start to feel better to ensure infection does not come back.  Please follow up with family medicine or urology next week if not improving.

## 2019-03-22 ENCOUNTER — Telehealth: Payer: Self-pay | Admitting: Emergency Medicine

## 2019-03-22 DIAGNOSIS — R300 Dysuria: Secondary | ICD-10-CM

## 2019-03-22 MED ORDER — CIPROFLOXACIN HCL 500 MG PO TABS: 500.0000 mg | ORAL_TABLET | Freq: Two times a day (BID) | ORAL | 0 refills | Status: DC

## 2019-03-23 ENCOUNTER — Telehealth: Payer: Self-pay

## 2019-03-23 NOTE — Telephone Encounter (Signed)
Lab called to inform of unlabeled specimen. Faxing over form now.

## 2019-03-26 ENCOUNTER — Encounter: Payer: Self-pay | Admitting: Family Medicine

## 2019-03-26 ENCOUNTER — Other Ambulatory Visit: Payer: Self-pay

## 2019-03-26 ENCOUNTER — Other Ambulatory Visit: Payer: Self-pay | Admitting: Family Medicine

## 2019-03-26 ENCOUNTER — Other Ambulatory Visit (HOSPITAL_COMMUNITY): Payer: Self-pay | Admitting: Family Medicine

## 2019-03-26 ENCOUNTER — Ambulatory Visit (INDEPENDENT_AMBULATORY_CARE_PROVIDER_SITE_OTHER): Payer: Medicare Other | Admitting: Family Medicine

## 2019-03-26 VITALS — BP 117/62 | HR 77 | Temp 98.6°F | Ht 68.0 in | Wt 170.0 lb

## 2019-03-26 DIAGNOSIS — N410 Acute prostatitis: Secondary | ICD-10-CM | POA: Diagnosis not present

## 2019-03-26 MED ORDER — TRAMADOL HCL 50 MG PO TABS: 50.0000 mg | ORAL_TABLET | Freq: Three times a day (TID) | ORAL | 0 refills | Status: AC | PRN

## 2019-03-26 NOTE — Progress Notes (Signed)
Pt was seen in UC o 03/21/2019 for urinary frequency and rectal pressure. He has Hx of prostatitis. Was told to follow up w/pcp or Urology for recheck of sxs.

## 2019-03-26 NOTE — Progress Notes (Signed)
I connected with [place] on 03/26/19 at  1:40 PM EST by a video enabled telemedicine application and verified that I am speaking with the correct person using two identifiers.  Because of the potential severity of the situation patient was asked to come in for an in person evaluation.  I saw him in the office about 2 hours later.   Subjective:    CC: Pelvic Pain   HPI: Austin Gill is following up for recent urgent care visit on November 15.  At time he presented with 3 days of urinary frequency.  He mostly complained of urinary frequency, urgency and some rectal pressure.  He had had a history of prostatitis years ago and was treated but had not had any problems since then he is never had any prior history of rectal abscess etc.  He reports he has been having normal bowel bowel movements.  No blood in stool.  He describes the pain in his rectal area is very sharp to the point where he had to sleep on his stomach last night.  He denies any fevers, chills, sweats etc.  He has not noticed any blood in his urine.  While at urgent care he had a normal white blood count as well as a negative urinalysis, and PSA is still pending.  He did have a rectal exam performed at that time and tenderness was indicated as well as an enlarged prostate gland.  He was also noted to have an internal hemorrhoid.  He was treated with Bactrim.  He actually called back because he experienced chest pain on the Bactrim and was switched to ciprofloxacin which she has now been on for the last 4 days.  Unfortunately his pain has progressed.   Past medical history, Surgical history, Family history not pertinant except as noted below, Social history, Allergies, and medications have been entered into the medical record, reviewed, and corrections made.   Review of Systems: No fevers, chills, night sweats, weight loss, chest pain, or shortness of breath.   Objective:    General: Speaking clearly in complete sentences without any  shortness of breath.  Alert and oriented x3.  Normal judgment. No apparent acute distress.  Physical Exam  Constitutional: He is oriented to person, place, and time. He appears well-developed and well-nourished.  HENT:  Head: Normocephalic and atraumatic.  Genitourinary:    Rectum normal.  Rectum:     No external hemorrhoid or abnormal anal tone.  Prostate is enlarged and tender.  Neurological: He is alert and oriented to person, place, and time.  Skin: Skin is warm and dry.  Psychiatric: He has a normal mood and affect. His behavior is normal.       Impression and Recommendations:   Rectal Pain -pain is persistent and increasing even after 4 days of ciprofloxacin.  No evidence of rectal abscess but very tender prostate exam.  As well as enlargement and bogginess.  Recommend MRI for further evaluation for possible prostate abscess.  We will get him scheduled at [facility]  The earliest we were able to get him scheduled was tomorrow at 3:00.  Certainly if he develops new symptoms fever chills sweats or lightheadedness dizziness nausea or vomiting then please go to the emergency department also given tramadol for pain.   I discussed the assessment and treatment plan with the patient. The patient was provided an opportunity to ask questions and all were answered. The patient agreed with the plan and demonstrated an understanding of the  instructions.   The patient was advised to call back or seek an in-person evaluation if the symptoms worsen or if the condition fails to improve as anticipated.   Beatrice Lecher, MD

## 2019-03-26 NOTE — Patient Instructions (Addendum)
Do not eat or drink 4 hours prior to appointment. [facility] Address: [address] [place], [place], [place] [address]. Arrive by 2:30 pm, enter through the Emergency Room.

## 2019-03-27 ENCOUNTER — Ambulatory Visit (HOSPITAL_COMMUNITY): Payer: Medicare Other

## 2019-03-28 ENCOUNTER — Ambulatory Visit (HOSPITAL_COMMUNITY)
Admission: RE | Admit: 2019-03-28 | Discharge: 2019-03-28 | Disposition: A | Discharge status: Home / Self Care | Payer: Medicare Other | Source: Ambulatory Visit | Attending: Family Medicine | Admitting: Family Medicine

## 2019-03-28 ENCOUNTER — Other Ambulatory Visit: Payer: Self-pay

## 2019-03-28 DIAGNOSIS — R350 Frequency of micturition: Secondary | ICD-10-CM | POA: Diagnosis not present

## 2019-03-28 DIAGNOSIS — N410 Acute prostatitis: Secondary | ICD-10-CM

## 2019-03-28 MED ORDER — GADOBUTROL 1 MMOL/ML IV SOLN
7.0000 mL | Freq: Once | INTRAVENOUS | Status: AC | PRN
  Administered 2019-03-28: 17:00:00 7 mL via INTRAVENOUS

## 2019-03-29 ENCOUNTER — Other Ambulatory Visit: Payer: Self-pay

## 2019-03-29 ENCOUNTER — Encounter: Payer: Self-pay | Admitting: Family Medicine

## 2019-03-29 DIAGNOSIS — N410 Acute prostatitis: Secondary | ICD-10-CM

## 2019-03-30 LAB — PAT ID TIQ DOC: Test Affected: 5363

## 2019-03-30 LAB — PSA: PSA: 0.5 ng/mL (ref <=4.0)

## 2019-05-07 ENCOUNTER — Encounter: Payer: Self-pay | Admitting: Family Medicine

## 2019-05-07 ENCOUNTER — Encounter: Payer: Self-pay | Admitting: Physician Assistant

## 2019-05-10 NOTE — Telephone Encounter (Signed)
See other MyChart message

## 2019-07-30 ENCOUNTER — Ambulatory Visit: Payer: Medicare Other | Attending: Internal Medicine

## 2019-07-30 DIAGNOSIS — Z23 Encounter for immunization: Secondary | ICD-10-CM

## 2019-07-30 NOTE — Progress Notes (Signed)
   Covid-19 Vaccination Clinic  Name:  Austin Gill    MRN: [id number] DOB: Jun 03, 1985  07/30/2019  Mr. Toure was observed post Covid-19 immunization for 15 minutes without incident. He was provided with Vaccine Information Sheet and instruction to access the V-Safe system.   Mr. Maione was instructed to call 911 with any severe reactions post vaccine: Marland Kitchen Difficulty breathing  . Swelling of face and throat  . A fast heartbeat  . A bad rash all over body  . Dizziness and weakness   Immunizations Administered    Name Date Dose VIS Date Route   Pfizer COVID-19 Vaccine 07/30/2019 10:07 AM 0.3 mL 04/16/2019 Intramuscular   Manufacturer: [employer], [employer]   Lot: [id number]   NDC: [address]-3

## 2019-08-23 ENCOUNTER — Ambulatory Visit: Payer: Medicare Other | Attending: Internal Medicine

## 2019-08-23 DIAGNOSIS — Z23 Encounter for immunization: Secondary | ICD-10-CM

## 2019-08-23 NOTE — Progress Notes (Signed)
   Covid-19 Vaccination Clinic  Name:  Austin Gill    MRN: [id number] DOB: 08/10/1985  08/23/2019  [name] was observed post Covid-19 immunization for 15 minutes without incident. He was provided with Vaccine Information Sheet and instruction to access the V-Safe system.   [name] was instructed to call 911 with any severe reactions post vaccine: Marland Kitchen Difficulty breathing  . Swelling of face and throat  . A fast heartbeat  . A bad rash all over body  . Dizziness and weakness   Immunizations Administered    Name Date Dose VIS Date Route   Pfizer COVID-19 Vaccine 08/23/2019  3:13 PM 0.3 mL 06/30/2018 Intramuscular   Manufacturer: [employer], [employer]   Lot: [id number]   NDC: [address]-2

## 2019-10-23 ENCOUNTER — Emergency Department: Admit: 2019-10-23 | Discharge status: Against Medical Advice | Payer: Medicare Other | Source: Home / Self Care

## 2019-10-23 ENCOUNTER — Ambulatory Visit: Payer: Medicare Other

## 2019-12-09 DIAGNOSIS — N489 Disorder of penis, unspecified: Secondary | ICD-10-CM | POA: Diagnosis not present

## 2019-12-09 DIAGNOSIS — N529 Male erectile dysfunction, unspecified: Secondary | ICD-10-CM | POA: Diagnosis not present

## 2019-12-15 ENCOUNTER — Other Ambulatory Visit: Payer: Self-pay

## 2019-12-15 ENCOUNTER — Emergency Department
Admission: EM | Admit: 2019-12-15 | Discharge: 2019-12-15 | Disposition: A | Discharge status: Home / Self Care | Payer: Medicare Other | Source: Home / Self Care

## 2019-12-15 NOTE — ED Notes (Signed)
Patient told registration he need to make a phone call outside. Patient called for room, no answer x3, not visualized on campus. Assumed LWBS.

## 2019-12-17 ENCOUNTER — Ambulatory Visit: Payer: Medicare Other | Admitting: Medical-Surgical

## 2019-12-17 DIAGNOSIS — H1031 Unspecified acute conjunctivitis, right eye: Secondary | ICD-10-CM | POA: Diagnosis not present

## 2019-12-17 DIAGNOSIS — H5789 Other specified disorders of eye and adnexa: Secondary | ICD-10-CM | POA: Diagnosis not present

## 2019-12-21 ENCOUNTER — Ambulatory Visit: Payer: Medicare Other | Admitting: Family Medicine

## 2019-12-21 ENCOUNTER — Ambulatory Visit: Payer: Medicare Other | Admitting: Medical-Surgical

## 2019-12-21 ENCOUNTER — Emergency Department (INDEPENDENT_AMBULATORY_CARE_PROVIDER_SITE_OTHER)
Admission: EM | Admit: 2019-12-21 | Discharge: 2019-12-21 | Disposition: A | Discharge status: Home / Self Care | Payer: Medicare Other | Source: Home / Self Care

## 2019-12-21 ENCOUNTER — Other Ambulatory Visit: Payer: Self-pay

## 2019-12-21 DIAGNOSIS — H18891 Other specified disorders of cornea, right eye: Secondary | ICD-10-CM | POA: Diagnosis not present

## 2019-12-21 MED ORDER — OLOPATADINE HCL 0.1 % OP SOLN: 1.0000 [drp] | Freq: Two times a day (BID) | OPHTHALMIC | 0 refills | Status: AC

## 2019-12-21 NOTE — ED Provider Notes (Signed)
[facility]    CSN: [id number] Arrival date & time: 12/21/19  1207      History   Chief Complaint Chief Complaint  Patient presents with  . Itchy Eye    HPI Austin Gill is a 34 y.o. male.   HPI Austin Gill is a 34 y.o. male presenting to UC with c/o Right eye irritation and itching for about 1 week.  He was assessed at [facility] and was prescribed antibiotic polysporin drops but states they do not seem to be helping and he notes he "did not get an eye exam."  He does note the redness has subsided slightly but the itching has continued.  He does not wear contacts or glasses.  Denies URI symptoms of cough, congestion, sore throat or HA. No trauma to the eye or sick contacts. No change in vision.   Past Medical History:  Diagnosis Date  . Anxiety   . Cluster headaches   . Depression   . GERD (gastroesophageal reflux disease)   . Stutter     Patient Active Problem List   Diagnosis Date Noted  . Acute midline low back pain without sciatica 06/06/2017  . Erectile dysfunction 06/06/2017  . Left testicular pain 05/30/2016  . Testicular nodule 05/30/2016  . Overweight (BMI 25.0-29.9) 05/30/2016  . Facial lesion 11/17/2014  . Adjustment disorder with mixed anxiety and depressed mood 11/23/2013  . Social anxiety disorder 11/23/2013  . Dysthymia 11/23/2013  . Essential tremor 09/28/2013  . Fatty liver 12/07/2012  . Sphenoidal sinus polyp 12/07/2012  . Anxiety and depression 11/10/2012  . GERD (gastroesophageal reflux disease) 11/10/2012  . Stutter 11/10/2012    Past Surgical History:  Procedure Laterality Date  . NO PAST SURGERIES         Home Medications    Prior to Admission medications   Medication Sig Start Date End Date Taking? Authorizing Provider  ciprofloxacin (CIPRO) 500 MG tablet Take 1 tablet (500 mg total) by mouth 2 (two) times daily. 03/22/19   Lattie Haw, MD  olopatadine (PATANOL) 0.1 % ophthalmic solution Place 1 drop  into the right eye 2 (two) times daily for 7 days. 12/21/19 12/28/19  Lurene Shadow, PA-C  omeprazole (PRILOSEC) 40 MG capsule Take 40 mg by mouth daily.    [provider]    Family History Family History  Problem Relation Age of Onset  . Benign prostatic hyperplasia Father   . Parkinson's disease Father   . Prostate cancer Paternal Grandfather   . Prostate cancer Paternal Uncle   . Migraines Mother     Social History Social History   Tobacco Use  . Smoking status: Never Smoker  . Smokeless tobacco: Never Used  Vaping Use  . Vaping Use: Never used  Substance Use Topics  . Alcohol use: Yes    Alcohol/week: 2.0 standard drinks    Types: 2 Standard drinks or equivalent per week    Comment: socially  . Drug use: No     Allergies   Bactrim [sulfamethoxazole-trimethoprim], Buspar [buspirone], and Paxil [paroxetine hcl]   Review of Systems Review of Systems  Constitutional: Negative for chills and fever.  HENT: Negative for congestion, ear pain, sinus pressure and sinus pain.   Eyes: Positive for discharge (watery), redness and itching. Negative for photophobia, pain and visual disturbance.  Neurological: Negative for dizziness and headaches.     Physical Exam Triage Vital Signs ED Triage Vitals  Enc Vitals Group     BP  12/21/19 1226 125/81     Pulse Rate 12/21/19 1226 97     Resp 12/21/19 1226 14     Temp 12/21/19 1226 99.4 F (37.4 C)     Temp Source 12/21/19 1226 Oral     SpO2 12/21/19 1226 100 %     Weight --      Height --      Head Circumference --      Peak Flow --      Pain Score 12/21/19 1225 1     Pain Loc --      Pain Edu? --      Excl. in GC? --    No data found.  Updated Vital Signs BP 125/81 (BP Location: Right Arm)   Pulse 97   Temp 99.4 F (37.4 C) (Oral)   Resp 14   SpO2 100%   Visual Acuity Right Eye Distance:   Left Eye Distance:   Bilateral Distance:    Right Eye Near:   Left Eye Near:    Bilateral Near:      Physical Exam Vitals and nursing note reviewed.  Constitutional:      General: He is not in acute distress.    Appearance: Normal appearance. He is well-developed. He is not ill-appearing, toxic-appearing or diaphoretic.  HENT:     Head: Normocephalic and atraumatic.     Right Ear: Tympanic membrane and ear canal normal.     Left Ear: Tympanic membrane and ear canal normal.     Nose: Nose normal.     Right Sinus: No maxillary sinus tenderness or frontal sinus tenderness.     Left Sinus: No maxillary sinus tenderness or frontal sinus tenderness.     Mouth/Throat:     Lips: Pink.     Mouth: Mucous membranes are moist.     Pharynx: Oropharynx is clear. Uvula midline.  Eyes:     General: Lids are normal. Lids are everted, no foreign bodies appreciated. Vision grossly intact.        Right eye: No foreign body, discharge or hordeolum.     Conjunctiva/sclera:     Right eye: Right conjunctiva is injected. No chemosis, exudate or hemorrhage.    Left eye: Left conjunctiva is not injected. No chemosis or hemorrhage.    Comments: Fluorescein used in Right eye, no uptake, no evidence of abrasion or ulceration. No foreign bodies seen.   Cardiovascular:     Rate and Rhythm: Normal rate.  Pulmonary:     Effort: Pulmonary effort is normal.  Musculoskeletal:        General: Normal range of motion.     Cervical back: Normal range of motion.  Skin:    General: Skin is warm and dry.  Neurological:     Mental Status: He is alert and oriented to person, place, and time.  Psychiatric:        Behavior: Behavior normal.      UC Treatments / Results  Labs (all labs ordered are listed, but only abnormal results are displayed) Labs Reviewed - No data to display  EKG   Radiology No results found.  Procedures Procedures (including critical care time)  Medications Ordered in UC Medications - No data to display  Initial Impression / Assessment and Plan / UC Course  I have reviewed the  triage vital signs and the nursing notes.  Pertinent labs & imaging results that were available during my care of the patient were reviewed by me and considered in my  medical decision making (see chart for details).    Will tx symptomatically Encouraged f/u with eye specialist tomorrow if possible, especially if not improving with eye drops prescribed today AVS given  Final Clinical Impressions(s) / UC Diagnoses   Final diagnoses:  Corneal irritation of right eye     Discharge Instructions      Use the resource guide provided to schedule a follow up visit with an eye specialist later this week for recheck of symptoms.    ED Prescriptions    Medication Sig Dispense Auth. Provider   olopatadine (PATANOL) 0.1 % ophthalmic solution Place 1 drop into the right eye 2 (two) times daily for 7 days. 5 mL Lurene Shadow, PA-C     PDMP not reviewed this encounter.   Lurene Shadow, [place] 12/23/19 [phone]

## 2019-12-21 NOTE — ED Triage Notes (Signed)
Patient presents to Urgent Care with complaints of right eye itchiness since last week. Patient reports he got it assessed and was put on antibiotic drops that do not seem to be helping. Pt states the redness has subsided slightly but it is still very itchy and watery, is wondering if he should be on a different eye drop.

## 2019-12-21 NOTE — Discharge Instructions (Signed)
°  Use the resource guide provided to schedule a follow up visit with an eye specialist later this week for recheck of symptoms.

## 2019-12-31 ENCOUNTER — Ambulatory Visit: Payer: Medicare Other | Admitting: Medical-Surgical

## 2020-01-25 DIAGNOSIS — B079 Viral wart, unspecified: Secondary | ICD-10-CM | POA: Diagnosis not present

## 2020-02-08 DIAGNOSIS — N489 Disorder of penis, unspecified: Secondary | ICD-10-CM | POA: Diagnosis not present

## 2020-11-21 ENCOUNTER — Emergency Department (INDEPENDENT_AMBULATORY_CARE_PROVIDER_SITE_OTHER)
Admission: RE | Admit: 2020-11-21 | Discharge: 2020-11-21 | Disposition: A | Discharge status: Home / Self Care | Payer: Medicare Other | Source: Ambulatory Visit | Attending: Family Medicine | Admitting: Family Medicine

## 2020-11-21 ENCOUNTER — Other Ambulatory Visit: Payer: Self-pay

## 2020-11-21 VITALS — BP 125/90 | HR 76 | Temp 98.7°F | Resp 20 | Ht 67.0 in | Wt 195.0 lb

## 2020-11-21 DIAGNOSIS — L247 Irritant contact dermatitis due to plants, except food: Secondary | ICD-10-CM

## 2020-11-21 MED ORDER — CEPHALEXIN 500 MG PO CAPS: 500.0000 mg | ORAL_CAPSULE | Freq: Two times a day (BID) | ORAL | 0 refills | Status: DC

## 2020-11-21 MED ORDER — HYDROXYZINE HCL 25 MG PO TABS: 25.0000 mg | ORAL_TABLET | ORAL | 0 refills | Status: DC | PRN

## 2020-11-21 MED ORDER — METHYLPREDNISOLONE SODIUM SUCC 125 MG IJ SOLR
80.0000 mg | Freq: Once | INTRAMUSCULAR | Status: AC
  Administered 2020-11-21: 80 mg via INTRAMUSCULAR

## 2020-11-21 MED ORDER — PREDNISONE 10 MG (21) PO TBPK: ORAL_TABLET | Freq: Every day | ORAL | 0 refills | Status: DC

## 2020-11-21 NOTE — ED Triage Notes (Signed)
Pt presents to Urgent Care with c/o rash to bilateral arms and legs and trunk x 5-6 days. Reports working in the yard the day prior to onset. Pt believes he has chiggers and that one area on his L foot may be infected d/t "scratching too hard."

## 2020-11-21 NOTE — Discharge Instructions (Addendum)
Antihistamines help with itching.  During the day you can take 2 Zyrtec or Claritin I am prescribing Atarax which is a stronger antihistamine, however, this can cause drowsiness Take the prednisone pack as directed Fill and take the Keflex only if you fail to improve with prednisone Try not to rub or scratch the rash See your primary care if not improving towards the end of the week

## 2020-11-21 NOTE — ED Provider Notes (Signed)
[facility]    CSN: [id number] Arrival date & time: 11/21/20  1133      History   Chief Complaint Chief Complaint  Patient presents with   Rash    HPI Austin Gill is a 35 y.o. male.   HPI  Patient has a terribly itchy rash on his skin.  Its been present since he trims some hedges.  This appears to be spreading, with more rash this morning.  Mostly on arms and legs.  This morning he had a small patch on his chest. He has many questions what this might be.  He mentions chiggers, bedbugs, monkey pox.  I reassured him that none of these infestations will cause a similar rash Patient states the rash is so pruritic that it is keeping him up at night.  He states the area on his leg is so itchy that it "hurts" and he feels like he may have an infection in the rash  Past Medical History:  Diagnosis Date   Anxiety    Cluster headaches    Depression    GERD (gastroesophageal reflux disease)    Stutter     Patient Active Problem List   Diagnosis Date Noted   Acute midline low back pain without sciatica 06/06/2017   Erectile dysfunction 06/06/2017   Left testicular pain 05/30/2016   Testicular nodule 05/30/2016   Overweight (BMI 25.0-29.9) 05/30/2016   Facial lesion 11/17/2014   Adjustment disorder with mixed anxiety and depressed mood 11/23/2013   Social anxiety disorder 11/23/2013   Dysthymia 11/23/2013   Essential tremor 09/28/2013   Fatty liver 12/07/2012   Sphenoidal sinus polyp 12/07/2012   Anxiety and depression 11/10/2012   GERD (gastroesophageal reflux disease) 11/10/2012   Stutter 11/10/2012    Past Surgical History:  Procedure Laterality Date   NO PAST SURGERIES         Home Medications    Prior to Admission medications   Medication Sig Start Date End Date Taking? Authorizing Provider  cephALEXin (KEFLEX) 500 MG capsule Take 1 capsule (500 mg total) by mouth 2 (two) times daily. 11/21/20  Yes Eustace Moore, MD  diphenhydrAMINE  (BENADRYL) 25 MG tablet Take 25 mg by mouth every 6 (six) hours as needed.   Yes [provider]  hydrOXYzine (ATARAX/VISTARIL) 25 MG tablet Take 1-2 tablets (25-50 mg total) by mouth every 4 (four) hours as needed. 11/21/20  Yes Eustace Moore, MD  predniSONE (STERAPRED UNI-PAK 21 TAB) 10 MG (21) TBPK tablet Take by mouth daily. tad 11/21/20  Yes Eustace Moore, MD  omeprazole (PRILOSEC) 40 MG capsule Take 40 mg by mouth daily.    [provider]    Family History Family History  Problem Relation Age of Onset   Migraines Mother    Benign prostatic hyperplasia Father    Parkinson's disease Father    Prostate cancer Paternal Grandfather    Prostate cancer Paternal Uncle     Social History Social History   Tobacco Use   Smoking status: Never   Smokeless tobacco: Never  Vaping Use   Vaping Use: Some days   Devices: 3-4 times weekly  Substance Use Topics   Alcohol use: Yes    Alcohol/week: 2.0 standard drinks    Types: 2 Standard drinks or equivalent per week    Comment: socially   Drug use: No     Allergies   Bactrim [sulfamethoxazole-trimethoprim], Buspar [buspirone], and Paxil [paroxetine hcl]   Review of Systems Review of  Systems See HPI  Physical Exam Triage Vital Signs ED Triage Vitals  Enc Vitals Group     BP 11/21/20 1148 125/90     Pulse Rate 11/21/20 1148 76     Resp 11/21/20 1148 20     Temp 11/21/20 1148 98.7 F (37.1 C)     Temp Source 11/21/20 1148 Oral     SpO2 11/21/20 1148 98 %     Weight 11/21/20 1144 195 lb (88.5 kg)     Height 11/21/20 1144 5\' 7"  (1.702 m)     Head Circumference --      Peak Flow --      Pain Score 11/21/20 1144 8     Pain Loc --      Pain Edu? --      Excl. in GC? --    No data found.  Updated Vital Signs BP 125/90   Pulse 76   Temp 98.7 F (37.1 C) (Oral)   Resp 20   Ht 5\' 7"  (1.702 m)   Wt 88.5 kg   SpO2 98%   BMI 30.54 kg/m      Physical Exam Constitutional:      General: He is  not in acute distress.    Appearance: Normal appearance. He is well-developed.     Comments: Pleasant.  Pronounced stutter  HENT:     Head: Normocephalic and atraumatic.     Mouth/Throat:     Comments: Mask is in place Eyes:     Conjunctiva/sclera: Conjunctivae normal.     Pupils: Pupils are equal, round, and reactive to light.  Cardiovascular:     Rate and Rhythm: Normal rate.  Pulmonary:     Effort: Pulmonary effort is normal. No respiratory distress.  Abdominal:     General: There is no distension.     Palpations: Abdomen is soft.  Musculoskeletal:        General: Normal range of motion.     Cervical back: Normal range of motion.  Skin:    General: Skin is warm and dry.     Comments: Both arms and legs have scattered lesion, mostly on the left leg and the left volar forearm and bicep.  They are vesicular, some linear, some in patches.  On the left ankle there is a area where there is erythema and soft tissue swelling on the anterior ankle just above the malleoli.  Nontender.  Neurological:     General: No focal deficit present.     Mental Status: He is alert.     Gait: Gait normal.     UC Treatments / Results  Labs (all labs ordered are listed, but only abnormal results are displayed) Labs Reviewed - No data to display  EKG   Radiology No results found.  Procedures Procedures (including critical care time)  Medications Ordered in UC Medications  methylPREDNISolone sodium succinate (SOLU-MEDROL) 125 mg/2 mL injection 80 mg (has no administration in time range)    Initial Impression / Assessment and Plan / UC Course  I have reviewed the triage vital signs and the nursing notes.  Pertinent labs & imaging results that were available during my care of the patient were reviewed by me and considered in my medical decision making (see chart for details).     Reassured patient that this is contact dermatitis.  I believe his rash is all inflammatory.  Do not see  evidence of infection.  Patient does have significant concern for infection so I am giving him  an antibiotic to fill and use if he fails to improve with prednisone or if the rash worsens.  Follow-up with PCP Final Clinical Impressions(s) / UC Diagnoses   Final diagnoses:  Irritant contact dermatitis due to plants, except food     Discharge Instructions      Antihistamines help with itching.  During the day you can take 2 Zyrtec or Claritin I am prescribing Atarax which is a stronger antihistamine, however, this can cause drowsiness Take the prednisone pack as directed Fill and take the Keflex only if you fail to improve with prednisone Try not to rub or scratch the rash See your primary care if not improving towards the end of the week   ED Prescriptions     Medication Sig Dispense Auth. Provider   predniSONE (STERAPRED UNI-PAK 21 TAB) 10 MG (21) TBPK tablet Take by mouth daily. tad 21 tablet Eustace Moore, MD   hydrOXYzine (ATARAX/VISTARIL) 25 MG tablet Take 1-2 tablets (25-50 mg total) by mouth every 4 (four) hours as needed. 20 tablet Eustace Moore, MD   cephALEXin (KEFLEX) 500 MG capsule Take 1 capsule (500 mg total) by mouth 2 (two) times daily. 10 capsule Eustace Moore, MD      PDMP not reviewed this encounter.   Eustace Moore, MD 11/21/20 1218

## 2020-12-23 ENCOUNTER — Emergency Department
Admission: EM | Admit: 2020-12-23 | Discharge: 2020-12-23 | Disposition: A | Discharge status: Home / Self Care | Payer: Medicare Other | Source: Home / Self Care | Attending: Family Medicine | Admitting: Family Medicine

## 2020-12-23 ENCOUNTER — Other Ambulatory Visit: Payer: Self-pay

## 2020-12-23 ENCOUNTER — Emergency Department: Admit: 2020-12-23 | Payer: Self-pay

## 2020-12-23 NOTE — ED Notes (Addendum)
Attempted to contact patient several times via cell phone and it goes straight to vm.  Patient is not in the lobby @ 1:59pm.  Attempted to contact patient again via cell phone listed and it continues to go straight vm.  Patient is not in the lobby @ 2:40pm.

## 2020-12-24 ENCOUNTER — Ambulatory Visit: Payer: Self-pay

## 2020-12-25 ENCOUNTER — Ambulatory Visit: Payer: Self-pay

## 2020-12-27 ENCOUNTER — Ambulatory Visit: Payer: Self-pay

## 2021-01-13 ENCOUNTER — Emergency Department (INDEPENDENT_AMBULATORY_CARE_PROVIDER_SITE_OTHER)
Admission: RE | Admit: 2021-01-13 | Discharge: 2021-01-13 | Disposition: A | Discharge status: Home / Self Care | Payer: Medicare Other | Source: Ambulatory Visit

## 2021-01-13 ENCOUNTER — Other Ambulatory Visit: Payer: Self-pay

## 2021-01-13 VITALS — BP 113/80 | HR 83 | Temp 98.4°F | Resp 20 | Ht 67.0 in | Wt 200.0 lb

## 2021-01-13 DIAGNOSIS — J0100 Acute maxillary sinusitis, unspecified: Secondary | ICD-10-CM | POA: Diagnosis not present

## 2021-01-13 DIAGNOSIS — J3489 Other specified disorders of nose and nasal sinuses: Secondary | ICD-10-CM | POA: Diagnosis not present

## 2021-01-13 DIAGNOSIS — J309 Allergic rhinitis, unspecified: Secondary | ICD-10-CM | POA: Diagnosis not present

## 2021-01-13 LAB — POC SARS CORONAVIRUS 2 AG -  ED: SARS Coronavirus 2 Ag: NEGATIVE

## 2021-01-13 MED ORDER — METHYLPREDNISOLONE SODIUM SUCC 125 MG IJ SOLR
125.0000 mg | Freq: Once | INTRAMUSCULAR | Status: AC
  Administered 2021-01-13: 125 mg via INTRAMUSCULAR

## 2021-01-13 MED ORDER — FEXOFENADINE HCL 180 MG PO TABS: 180.0000 mg | ORAL_TABLET | Freq: Every day | ORAL | 0 refills | Status: DC

## 2021-01-13 MED ORDER — CEFDINIR 300 MG PO CAPS: 300.0000 mg | ORAL_CAPSULE | Freq: Two times a day (BID) | ORAL | 0 refills | Status: AC

## 2021-01-13 NOTE — Discharge Instructions (Addendum)
Advised patient to take medication as directed with food to completion.  Advised/instructed patient may take Allegra daily for the next 7 days, then as needed for concurrent postnasal drainage.  Encourage patient increase daily water intake while taking these medications.

## 2021-01-13 NOTE — ED Provider Notes (Signed)
[facility]    CSN: [id number] Arrival date & time: 01/13/21  1443      History   Chief Complaint Chief Complaint  Patient presents with   Fever   Headache   Generalized Body Aches    HPI Austin Gill is a 35 y.o. male.   HPI 35 year old male presents with sinus nasal congestion, sinus pressure, fever, and generalized body aches for 5 to 7 days.  Patient is vaccinated for COVID-19.  Past Medical History:  Diagnosis Date   Anxiety    Cluster headaches    Depression    GERD (gastroesophageal reflux disease)    Stutter     Patient Active Problem List   Diagnosis Date Noted   Acute midline low back pain without sciatica 06/06/2017   Erectile dysfunction 06/06/2017   Left testicular pain 05/30/2016   Testicular nodule 05/30/2016   Overweight (BMI 25.0-29.9) 05/30/2016   Facial lesion 11/17/2014   Adjustment disorder with mixed anxiety and depressed mood 11/23/2013   Social anxiety disorder 11/23/2013   Dysthymia 11/23/2013   Essential tremor 09/28/2013   Fatty liver 12/07/2012   Sphenoidal sinus polyp 12/07/2012   Anxiety and depression 11/10/2012   GERD (gastroesophageal reflux disease) 11/10/2012   Stutter 11/10/2012    Past Surgical History:  Procedure Laterality Date   NO PAST SURGERIES         Home Medications    Prior to Admission medications   Medication Sig Start Date End Date Taking? Authorizing Provider  acetaminophen (TYLENOL) 325 MG tablet Take 650 mg by mouth every 6 (six) hours as needed.   Yes [provider]  cefdinir (OMNICEF) 300 MG capsule Take 1 capsule (300 mg total) by mouth 2 (two) times daily for 7 days. 01/13/21 01/20/21 Yes Trevor Iha, FNP  fexofenadine [facility] ALLERGY) 180 MG tablet Take 1 tablet (180 mg total) by mouth daily for 15 days. 01/13/21 01/28/21 Yes Trevor Iha, FNP  cephALEXin (KEFLEX) 500 MG capsule Take 1 capsule (500 mg total) by mouth 2 (two) times daily. 11/21/20   Eustace Moore, MD   diphenhydrAMINE (BENADRYL) 25 MG tablet Take 25 mg by mouth every 6 (six) hours as needed.    [provider]  hydrOXYzine (ATARAX/VISTARIL) 25 MG tablet Take 1-2 tablets (25-50 mg total) by mouth every 4 (four) hours as needed. 11/21/20   Eustace Moore, MD  omeprazole (PRILOSEC) 40 MG capsule Take 40 mg by mouth daily.    [provider]  predniSONE (STERAPRED UNI-PAK 21 TAB) 10 MG (21) TBPK tablet Take by mouth daily. tad 11/21/20   Eustace Moore, MD    Family History Family History  Problem Relation Age of Onset   Migraines Mother    Benign prostatic hyperplasia Father    Parkinson's disease Father    Prostate cancer Paternal Grandfather    Prostate cancer Paternal Uncle     Social History Social History   Tobacco Use   Smoking status: Never   Smokeless tobacco: Never  Vaping Use   Vaping Use: Some days   Devices: 3-4 times weekly  Substance Use Topics   Alcohol use: Yes    Alcohol/week: 2.0 standard drinks    Types: 2 Standard drinks or equivalent per week    Comment: socially   Drug use: No     Allergies   Bactrim [sulfamethoxazole-trimethoprim], Buspar [buspirone], and Paxil [paroxetine hcl]   Review of Systems Review of Systems  Constitutional:  Positive for fatigue and fever.  HENT:  Positive for postnasal drip.   Respiratory:  Positive for cough.   All other systems reviewed and are negative.   Physical Exam Triage Vital Signs ED Triage Vitals  Enc Vitals Group     BP 01/13/21 1502 113/80     Pulse Rate 01/13/21 1502 83     Resp 01/13/21 1502 20     Temp 01/13/21 1502 98.4 F (36.9 C)     Temp Source 01/13/21 1502 Oral     SpO2 01/13/21 1502 97 %     Weight 01/13/21 1459 200 lb (90.7 kg)     Height 01/13/21 1459 5\' 7"  (1.702 m)     Head Circumference --      Peak Flow --      Pain Score --      Pain Loc --      Pain Edu? --      Excl. in GC? --    No data found.  Updated Vital Signs BP 113/80 (BP Location: Right  Arm)   Pulse 83   Temp 98.4 F (36.9 C) (Oral)   Resp 20   Ht 5\' 7"  (1.702 m)   Wt 200 lb (90.7 kg)   SpO2 97%   BMI 31.32 kg/m    Physical Exam Vitals and nursing note reviewed.  Constitutional:      General: He is not in acute distress.    Appearance: Normal appearance. He is normal weight. He is not ill-appearing.  HENT:     Head: Normocephalic and atraumatic.     Right Ear: Hearing, tympanic membrane and external ear normal.     Left Ear: Hearing, tympanic membrane and external ear normal.     Ears:     Comments: Moderate eustachian tube dysfunction noted bilaterally    Nose:     Right Turbinates: Enlarged.     Left Turbinates: Enlarged.     Right Sinus: Maxillary sinus tenderness present.     Left Sinus: Maxillary sinus tenderness present.     Comments: Turbinates are erythematous, edematous    Mouth/Throat:     Lips: Pink.     Pharynx: Oropharynx is clear. Uvula midline.     Comments: Moderate amount of clear drainage of posterior oropharynx noted Eyes:     Extraocular Movements: Extraocular movements intact.     Conjunctiva/sclera: Conjunctivae normal.     Pupils: Pupils are equal, round, and reactive to light.  Cardiovascular:     Rate and Rhythm: Normal rate and regular rhythm.     Pulses: Normal pulses.     Heart sounds: Normal heart sounds.  Pulmonary:     Effort: Pulmonary effort is normal.     Breath sounds: Normal breath sounds. No wheezing, rhonchi or rales.  Musculoskeletal:        General: Normal range of motion.     Cervical back: Normal range of motion and neck supple. No tenderness.  Lymphadenopathy:     Cervical: No cervical adenopathy.  Skin:    General: Skin is warm and dry.  Neurological:     General: No focal deficit present.     Mental Status: He is alert and oriented to person, place, and time. Mental status is at baseline.  Psychiatric:        Mood and Affect: Mood normal.        Behavior: Behavior normal.        Thought Content:  Thought content normal.     UC Treatments / Results  Labs (all labs ordered are listed, but only abnormal results are displayed) Labs Reviewed  POC SARS CORONAVIRUS 2 AG -  ED    EKG   Radiology No results found.  Procedures Procedures (including critical care time)  Medications Ordered in UC Medications  methylPREDNISolone sodium succinate (SOLU-MEDROL) 125 mg/2 mL injection 125 mg (125 mg Intramuscular Given 01/13/21 1544)    Initial Impression / Assessment and Plan / UC Course  I have reviewed the triage vital signs and the nursing notes.  Pertinent labs & imaging results that were available during my care of the patient were reviewed by me and considered in my medical decision making (see chart for details).     MDM: 1.  Acute maxillary sinusitis, recurrence not specified-Rx'd cefdinir; 2. Sinus pressure-IM Solu-Medrol 125 given once in clinic prior to discharge today; 3.  Allergic rhinitis, unspecified seasonality-Rx'd Allegra. Advised patient to take medication as directed with food to completion.  Advised/instructed patient may take Allegra daily for the next 7 days, then as needed for concurrent postnasal drainage.  Encourage patient increase daily water intake while taking these medications.  Patient discharged home, hemodynamically stable. Final Clinical Impressions(s) / UC Diagnoses   Final diagnoses:  Acute maxillary sinusitis, recurrence not specified  Sinus pressure  Allergic rhinitis, unspecified seasonality, unspecified trigger     Discharge Instructions      Advised patient to take medication as directed with food to completion.  Advised/instructed patient may take Allegra daily for the next 7 days, then as needed for concurrent postnasal drainage.  Encourage patient increase daily water intake while taking these medications.     ED Prescriptions     Medication Sig Dispense Auth. Provider   cefdinir (OMNICEF) 300 MG capsule Take 1 capsule (300 mg  total) by mouth 2 (two) times daily for 7 days. 14 capsule Trevor Iha, FNP   fexofenadine [facility] ALLERGY) 180 MG tablet Take 1 tablet (180 mg total) by mouth daily for 15 days. 15 tablet Trevor Iha, FNP      PDMP not reviewed this encounter.   Trevor Iha, FNP 01/13/21 1558

## 2021-01-13 NOTE — ED Notes (Signed)
Pt c/o pain to L outer buttocks area where injection was given. Denies numbness/tingling. Warm compress given.

## 2021-01-13 NOTE — ED Triage Notes (Signed)
Pt presents to Urgent Care with c/o fever, headache, and body aches since yesterday. Also reports sinus pain/pressure. Pt has not done a COVID test; has been vaccinated.

## 2021-01-16 ENCOUNTER — Other Ambulatory Visit (HOSPITAL_COMMUNITY)
Admission: RE | Admit: 2021-01-16 | Discharge: 2021-01-16 | Disposition: A | Discharge status: Home / Self Care | Payer: Medicare Other | Source: Ambulatory Visit | Attending: Family Medicine | Admitting: Family Medicine

## 2021-01-16 ENCOUNTER — Other Ambulatory Visit: Payer: Self-pay

## 2021-01-16 ENCOUNTER — Emergency Department (INDEPENDENT_AMBULATORY_CARE_PROVIDER_SITE_OTHER)
Admission: RE | Admit: 2021-01-16 | Discharge: 2021-01-16 | Disposition: A | Discharge status: Home / Self Care | Payer: Medicare Other | Source: Ambulatory Visit | Attending: Family Medicine | Admitting: Family Medicine

## 2021-01-16 VITALS — BP 107/59 | HR 85 | Temp 98.8°F | Resp 20 | Ht 67.0 in | Wt 200.0 lb

## 2021-01-16 DIAGNOSIS — R109 Unspecified abdominal pain: Secondary | ICD-10-CM | POA: Diagnosis not present

## 2021-01-16 DIAGNOSIS — K6289 Other specified diseases of anus and rectum: Secondary | ICD-10-CM

## 2021-01-16 DIAGNOSIS — Z113 Encounter for screening for infections with a predominantly sexual mode of transmission: Secondary | ICD-10-CM | POA: Insufficient documentation

## 2021-01-16 MED ORDER — LIDOCAINE 5 % EX OINT: 1.0000 "application " | TOPICAL_OINTMENT | CUTANEOUS | 0 refills | Status: DC | PRN

## 2021-01-16 MED ORDER — HYDROCODONE-ACETAMINOPHEN 7.5-325 MG PO TABS: 1.0000 | ORAL_TABLET | Freq: Four times a day (QID) | ORAL | 0 refills | Status: DC | PRN

## 2021-01-16 MED ORDER — POLYETHYLENE GLYCOL 3350 17 G PO PACK: 17.0000 g | PACK | Freq: Every day | ORAL | 0 refills | Status: DC

## 2021-01-16 NOTE — ED Provider Notes (Signed)
[facility]    CSN: [id number] Arrival date & time: 01/16/21  1149      History   Chief Complaint Chief Complaint  Patient presents with   Abdominal Pain   Hemorrhoids   Nausea    HPI Austin Gill is a 35 y.o. male.   HPI  Austin Gill is here complaining of severe rectal pain for 2 days.  He states that he has not had any constipation.  He did have some diarrhea yesterday but he has not had any today.  No abdominal pain.  No fever or chills.  No blood in bowels.  He is afraid he may have an STD.  We discussed he might have a hemorrhoid, anal fissure, or proctitis that could be causing his pain.  He is using Preparation H with no improvement.  Past Medical History:  Diagnosis Date   Anxiety    Cluster headaches    Depression    GERD (gastroesophageal reflux disease)    Stutter     Patient Active Problem List   Diagnosis Date Noted   Acute midline low back pain without sciatica 06/06/2017   Erectile dysfunction 06/06/2017   Left testicular pain 05/30/2016   Testicular nodule 05/30/2016   Overweight (BMI 25.0-29.9) 05/30/2016   Facial lesion 11/17/2014   Adjustment disorder with mixed anxiety and depressed mood 11/23/2013   Social anxiety disorder 11/23/2013   Dysthymia 11/23/2013   Essential tremor 09/28/2013   Fatty liver 12/07/2012   Sphenoidal sinus polyp 12/07/2012   Anxiety and depression 11/10/2012   GERD (gastroesophageal reflux disease) 11/10/2012   Stutter 11/10/2012    Past Surgical History:  Procedure Laterality Date   NO PAST SURGERIES         Home Medications    Prior to Admission medications   Medication Sig Start Date End Date Taking? Authorizing Provider  acetaminophen (TYLENOL) 325 MG tablet Take 650 mg by mouth every 6 (six) hours as needed.    [provider]  cefdinir (OMNICEF) 300 MG capsule Take 1 capsule (300 mg total) by mouth 2 (two) times daily for 7 days. 01/13/21 01/20/21  Trevor Iha, FNP  diphenhydrAMINE  (BENADRYL) 25 MG tablet Take 25 mg by mouth every 6 (six) hours as needed.    [provider]  fexofenadine (ALLEGRA ALLERGY) 180 MG tablet Take 1 tablet (180 mg total) by mouth daily for 15 days. 01/13/21 01/28/21  Trevor Iha, FNP  HYDROcodone-acetaminophen (NORCO) 7.5-325 MG tablet Take 1 tablet by mouth every 6 (six) hours as needed for moderate pain. 01/16/21  Yes Eustace Moore, MD  lidocaine (XYLOCAINE) 5 % ointment Apply 1 application topically as needed. 01/16/21  Yes Eustace Moore, MD  omeprazole (PRILOSEC) 40 MG capsule Take 40 mg by mouth daily.    [provider]  polyethylene glycol (MIRALAX / GLYCOLAX) 17 g packet Take 17 g by mouth daily. 01/16/21  Yes Eustace Moore, MD    Family History Family History  Problem Relation Age of Onset   Migraines Mother    Benign prostatic hyperplasia Father    Parkinson's disease Father    Prostate cancer Paternal Grandfather    Prostate cancer Paternal Uncle     Social History Social History   Tobacco Use   Smoking status: Never   Smokeless tobacco: Never  Vaping Use   Vaping Use: Some days   Devices: 3-4 times weekly  Substance Use Topics   Alcohol use: Yes    Alcohol/week: 2.0 standard  drinks    Types: 2 Standard drinks or equivalent per week    Comment: socially   Drug use: No     Allergies   Bactrim [sulfamethoxazole-trimethoprim], Buspar [buspirone], and Paxil [paroxetine hcl]   Review of Systems Review of Systems See HPI  Physical Exam Triage Vital Signs ED Triage Vitals  Enc Vitals Group     BP 01/16/21 1207 (!) 107/59     Pulse Rate 01/16/21 1207 85     Resp 01/16/21 1207 20     Temp 01/16/21 1207 98.8 F (37.1 C)     Temp Source 01/16/21 1207 Oral     SpO2 01/16/21 1207 97 %     Weight 01/16/21 1203 200 lb (90.7 kg)     Height 01/16/21 1203 5\' 7"  (1.702 m)     Head Circumference --      Peak Flow --      Pain Score 01/16/21 1200 10     Pain Loc --      Pain Edu? --       Excl. in GC? --    No data found.  Updated Vital Signs BP (!) 107/59 (BP Location: Right Arm)   Pulse 85   Temp 98.8 F (37.1 C) (Oral)   Resp 20   Ht 5\' 7"  (1.702 m)   Wt 90.7 kg   SpO2 97%   BMI 31.32 kg/m      Physical Exam Constitutional:      General: He is not in acute distress.    Appearance: He is well-developed.  HENT:     Head: Normocephalic and atraumatic.     Mouth/Throat:     Comments: Mask is in place Eyes:     Conjunctiva/sclera: Conjunctivae normal.     Pupils: Pupils are equal, round, and reactive to light.  Cardiovascular:     Rate and Rhythm: Normal rate.  Pulmonary:     Effort: Pulmonary effort is normal. No respiratory distress.  Abdominal:     General: There is no distension.     Palpations: Abdomen is soft.  Genitourinary:    Comments: Rectal area looks normal.  No fissure, redness, or mass.  Rectal exam is not possible secondary to severe pain.  I was only able to enter 2 cm into the rectal region without extreme pain.  I did feel a firm area at tip of finger. Musculoskeletal:        General: Normal range of motion.     Cervical back: Normal range of motion.  Skin:    General: Skin is warm and dry.  Neurological:     General: No focal deficit present.     Mental Status: He is alert.  Psychiatric:        Mood and Affect: Mood normal.        Behavior: Behavior normal.     UC Treatments / Results  Labs (all labs ordered are listed, but only abnormal results are displayed) Labs Reviewed - No data to display  EKG   Radiology No results found.  Procedures Procedures (including critical care time)  Medications Ordered in UC Medications - No data to display  Initial Impression / Assessment and Plan / UC Course  I have reviewed the triage vital signs and the nursing notes.  Pertinent labs & imaging results that were available during my care of the patient were reviewed by me and considered in my medical decision making (see chart  for details).     Patient  is on antibiotics.  Cardiac pain management, stool softeners, and advice for anal fissure.  Not sure what else is causing his severe rectal pain.  Follow-up with PCP if fails to improve. Final Clinical Impressions(s) / UC Diagnoses   Final diagnoses:  Rectal pain     Discharge Instructions      Drink lots of water Take the stool softener MiraLAX once a day Use wet wipes instead of toilet tissue Take hydrocodone as needed for pain.  This can cause constipation so make sure you use your MiraLAX Your swab results will be available on MyChart You can use lidocaine gel prior to washing or bowel movement to reduce pain See your primary care doctor if you fail to improve by the end of the week     ED Prescriptions     Medication Sig Dispense Auth. Provider   HYDROcodone-acetaminophen (NORCO) 7.5-325 MG tablet Take 1 tablet by mouth every 6 (six) hours as needed for moderate pain. 15 tablet Eustace Moore, MD   lidocaine (XYLOCAINE) 5 % ointment Apply 1 application topically as needed. 30 g Eustace Moore, MD   polyethylene glycol (MIRALAX / GLYCOLAX) 17 g packet Take 17 g by mouth daily. 14 each Eustace Moore, MD      I have reviewed the PDMP during this encounter.   Eustace Moore, MD 01/16/21 1248

## 2021-01-16 NOTE — Discharge Instructions (Signed)
Drink lots of water Take the stool softener MiraLAX once a day Use wet wipes instead of toilet tissue Take hydrocodone as needed for pain.  This can cause constipation so make sure you use your MiraLAX Your swab results will be available on MyChart You can use lidocaine gel prior to washing or bowel movement to reduce pain See your primary care doctor if you fail to improve by the end of the week

## 2021-01-16 NOTE — ED Triage Notes (Addendum)
Pt presents to Urgent Care with c/o generalized abdominal pain, nausea, intermittent diarrhea, and painful hemorrhoids x 2 days. Has been using Prep-H w/o relief. Pt has been taking Cefdinir for sinusitis since 01/13/21. Also reports neck/shoulder pain since yesterday.

## 2021-01-17 DIAGNOSIS — K6289 Other specified diseases of anus and rectum: Secondary | ICD-10-CM | POA: Diagnosis not present

## 2021-01-17 DIAGNOSIS — K625 Hemorrhage of anus and rectum: Secondary | ICD-10-CM | POA: Diagnosis not present

## 2021-01-17 DIAGNOSIS — R1013 Epigastric pain: Secondary | ICD-10-CM | POA: Diagnosis not present

## 2021-01-17 LAB — CYTOLOGY, (ORAL, ANAL, URETHRAL) ANCILLARY ONLY
Chlamydia: NEGATIVE
Comment: NEGATIVE
Comment: NEGATIVE
Comment: NORMAL
Neisseria Gonorrhea: NEGATIVE
Trichomonas: NEGATIVE

## 2021-01-19 ENCOUNTER — Ambulatory Visit: Payer: Self-pay

## 2021-01-19 DIAGNOSIS — R21 Rash and other nonspecific skin eruption: Secondary | ICD-10-CM | POA: Diagnosis not present

## 2021-01-19 DIAGNOSIS — Z87891 Personal history of nicotine dependence: Secondary | ICD-10-CM | POA: Diagnosis not present

## 2021-01-19 DIAGNOSIS — F419 Anxiety disorder, unspecified: Secondary | ICD-10-CM | POA: Diagnosis not present

## 2021-01-19 DIAGNOSIS — K6289 Other specified diseases of anus and rectum: Secondary | ICD-10-CM | POA: Diagnosis not present

## 2021-01-19 DIAGNOSIS — Z79899 Other long term (current) drug therapy: Secondary | ICD-10-CM | POA: Diagnosis not present

## 2021-01-19 DIAGNOSIS — Z888 Allergy status to other drugs, medicaments and biological substances status: Secondary | ICD-10-CM | POA: Diagnosis not present

## 2021-06-04 ENCOUNTER — Other Ambulatory Visit: Payer: Self-pay

## 2021-06-04 ENCOUNTER — Emergency Department (INDEPENDENT_AMBULATORY_CARE_PROVIDER_SITE_OTHER)
Admission: EM | Admit: 2021-06-04 | Discharge: 2021-06-04 | Disposition: A | Discharge status: Home / Self Care | Payer: Medicare Other | Source: Home / Self Care | Attending: Family Medicine | Admitting: Family Medicine

## 2021-06-04 DIAGNOSIS — R197 Diarrhea, unspecified: Secondary | ICD-10-CM | POA: Diagnosis not present

## 2021-06-04 DIAGNOSIS — R0981 Nasal congestion: Secondary | ICD-10-CM

## 2021-06-04 LAB — POC SARS CORONAVIRUS 2 AG -  ED: SARS Coronavirus 2 Ag: NEGATIVE

## 2021-06-04 MED ORDER — CIPROFLOXACIN HCL 500 MG PO TABS: 500.0000 mg | ORAL_TABLET | Freq: Two times a day (BID) | ORAL | 0 refills | Status: DC

## 2021-06-04 MED ORDER — LOPERAMIDE HCL 2 MG PO CAPS: 2.0000 mg | ORAL_CAPSULE | Freq: Two times a day (BID) | ORAL | 0 refills | Status: DC | PRN

## 2021-06-04 NOTE — Discharge Instructions (Signed)
Take Cipro 2 times a day for 5 days Take a probiotic while on the Cipro Consider Imodium for the diarrhea.  Take only when diarrhea is severe Make sure you are drinking lots of water Avoid fried and fatty foods, highly spiced foods until the diarrhea has improved See your doctor if not better towards the end of the week

## 2021-06-04 NOTE — ED Provider Notes (Signed)
[name]    CSN: [id number] Arrival date & time: 06/04/21  1148      History   Chief Complaint Chief Complaint  Patient presents with   Nasal Congestion   Diarrhea    HPI Austin Gill is a 36 y.o. male.   HPI  Patient is here for an upper respiratory infection.  He has a cough and runny nose.  Clear rhinorrhea.  Its been going on for 5 to 6 days.  In addition he has watery diarrhea.  He states that the diarrhea is severe and random.  Not related to food.  No fever.  He has had episodes where he has lost control of his bowels.  No blood in the bowels. He has missed 3 days of work and needs a note.  Past Medical History:  Diagnosis Date   Anxiety    Cluster headaches    Depression    GERD (gastroesophageal reflux disease)    Stutter     Patient Active Problem List   Diagnosis Date Noted   Acute midline low back pain without sciatica 06/06/2017   Erectile dysfunction 06/06/2017   Left testicular pain 05/30/2016   Testicular nodule 05/30/2016   Overweight (BMI 25.0-29.9) 05/30/2016   Facial lesion 11/17/2014   Adjustment disorder with mixed anxiety and depressed mood 11/23/2013   Social anxiety disorder 11/23/2013   Dysthymia 11/23/2013   Essential tremor 09/28/2013   Fatty liver 12/07/2012   Sphenoidal sinus polyp 12/07/2012   Anxiety and depression 11/10/2012   GERD (gastroesophageal reflux disease) 11/10/2012   Stutter 11/10/2012    Past Surgical History:  Procedure Laterality Date   NO PAST SURGERIES         Home Medications    Prior to Admission medications   Medication Sig Start Date End Date Taking? Authorizing Provider  ciprofloxacin (CIPRO) 500 MG tablet Take 1 tablet (500 mg total) by mouth every 12 (twelve) hours. 06/04/21  Yes Raylene Everts, MD  loperamide (IMODIUM) 2 MG capsule Take 1 capsule (2 mg total) by mouth 2 (two) times daily as needed for diarrhea or loose stools. 06/04/21  Yes Raylene Everts, MD   acetaminophen (TYLENOL) 325 MG tablet Take 650 mg by mouth every 6 (six) hours as needed.    [provider]  diphenhydrAMINE (BENADRYL) 25 MG tablet Take 25 mg by mouth every 6 (six) hours as needed.    [provider]  omeprazole (PRILOSEC) 40 MG capsule Take 40 mg by mouth daily.    [provider]    Family History Family History  Problem Relation Age of Onset   Migraines Mother    Benign prostatic hyperplasia Father    Parkinson's disease Father    Prostate cancer Paternal Grandfather    Prostate cancer Paternal Uncle     Social History Social History   Tobacco Use   Smoking status: Never   Smokeless tobacco: Never  Vaping Use   Vaping Use: Some days   Devices: 3-4 times weekly  Substance Use Topics   Alcohol use: Yes    Alcohol/week: 2.0 standard drinks    Types: 2 Standard drinks or equivalent per week    Comment: socially   Drug use: No     Allergies   Bactrim [sulfamethoxazole-trimethoprim], Buspar [buspirone], and Paxil [paroxetine hcl]   Review of Systems Review of Systems See HPI  Physical Exam Triage Vital Signs ED Triage Vitals  Enc Vitals Group     BP 06/04/21 1155 (!)  146/86     Pulse Rate 06/04/21 1155 93     Resp 06/04/21 1155 17     Temp 06/04/21 1155 98.4 F (36.9 C)     Temp Source 06/04/21 1155 Oral     SpO2 06/04/21 1155 97 %     Weight --      Height --      Head Circumference --      Peak Flow --      Pain Score 06/04/21 1157 0     Pain Loc --      Pain Edu? --      Excl. in [place]? --    No data found.  Updated Vital Signs BP (!) 146/86 (BP Location: Right Arm)    Pulse 93    Temp 98.4 F (36.9 C) (Oral)    Resp 17    SpO2 97%     Physical Exam Constitutional:      General: He is not in acute distress.    Appearance: Normal appearance. He is well-developed. He is not ill-appearing.  HENT:     Head: Normocephalic and atraumatic.     Right Ear: Tympanic membrane and ear canal normal.     Left  Ear: Tympanic membrane and ear canal normal.     Nose: Congestion present.     Mouth/Throat:     Mouth: Mucous membranes are moist.     Pharynx: No posterior oropharyngeal erythema.  Eyes:     Conjunctiva/sclera: Conjunctivae normal.     Pupils: Pupils are equal, round, and reactive to light.  Cardiovascular:     Rate and Rhythm: Normal rate and regular rhythm.     Heart sounds: Normal heart sounds.  Pulmonary:     Effort: Pulmonary effort is normal. No respiratory distress.     Breath sounds: Normal breath sounds.  Abdominal:     General: There is no distension.     Palpations: Abdomen is soft.     Tenderness: There is no abdominal tenderness.  Musculoskeletal:        General: Normal range of motion.     Cervical back: Normal range of motion.  Lymphadenopathy:     Cervical: No cervical adenopathy.  Skin:    General: Skin is warm and dry.  Neurological:     Mental Status: He is alert.     UC Treatments / Results  Labs (all labs ordered are listed, but only abnormal results are displayed) Labs Reviewed  POC SARS CORONAVIRUS 2 AG -  ED    EKG   Radiology No results found.  Procedures Procedures (including critical care time)  Medications Ordered in UC Medications - No data to display  Initial Impression / Assessment and Plan / UC Course  I have reviewed the triage vital signs and the nursing notes.  Pertinent labs & imaging results that were available during my care of the patient were reviewed by me and considered in my medical decision making (see chart for details).     Patient has severe diarrhea with fecal incontinence that is not getting better after 5 days of over-the-counter medications.  Pepto-Bismol was not helpful.  He feels eager to get back to work.  I will treat him with antibiotics, Imodium, fluids, follow-up if fails to improve Final Clinical Impressions(s) / UC Diagnoses   Final diagnoses:  Nasal congestion  Diarrhea of presumed infectious  origin     Discharge Instructions      Take Cipro 2 times a day  for 5 days Take a probiotic while on the Cipro Consider Imodium for the diarrhea.  Take only when diarrhea is severe Make sure you are drinking lots of water Avoid fried and fatty foods, highly spiced foods until the diarrhea has improved See your doctor if not better towards the end of the week   ED Prescriptions     Medication Sig Dispense Auth. Provider   ciprofloxacin (CIPRO) 500 MG tablet Take 1 tablet (500 mg total) by mouth every 12 (twelve) hours. 10 tablet Raylene Everts, MD   loperamide (IMODIUM) 2 MG capsule Take 1 capsule (2 mg total) by mouth 2 (two) times daily as needed for diarrhea or loose stools. 10 capsule Raylene Everts, MD      PDMP not reviewed this encounter.   Raylene Everts, MD 06/04/21 [phone]

## 2021-06-04 NOTE — ED Triage Notes (Signed)
Pt c/o nasal congestion/facial pain and diarrhea since last Tuesday. Ran a fever two days ago. Taking allegra and tylenol prn.

## 2021-08-20 ENCOUNTER — Ambulatory Visit: Payer: Self-pay

## 2021-08-21 ENCOUNTER — Ambulatory Visit: Payer: Self-pay

## 2021-08-23 ENCOUNTER — Emergency Department (INDEPENDENT_AMBULATORY_CARE_PROVIDER_SITE_OTHER)
Admission: RE | Admit: 2021-08-23 | Discharge: 2021-08-23 | Disposition: A | Discharge status: Home / Self Care | Payer: Medicare Other | Source: Ambulatory Visit | Attending: Family Medicine | Admitting: Family Medicine

## 2021-08-23 VITALS — BP 142/83 | HR 69 | Temp 98.4°F | Resp 18

## 2021-08-23 DIAGNOSIS — J069 Acute upper respiratory infection, unspecified: Secondary | ICD-10-CM

## 2021-08-23 LAB — POC SARS CORONAVIRUS 2 AG -  ED: SARS Coronavirus 2 Ag: NEGATIVE

## 2021-08-23 NOTE — Discharge Instructions (Signed)
Covid test is negative ?Drink plenty of water ?Take OTC cough and cold medicine as needed/  Delsym or mucinex DM are good for cough ? ?

## 2021-08-23 NOTE — ED Triage Notes (Signed)
Pt c/o cough and congestion since Saturday morning. Started off as scratchy throat. No known fever. States he does feel tightness in his chest. Taking tylenol and nyquil prn. Hx of sinus infections. ?

## 2021-08-23 NOTE — ED Provider Notes (Signed)
?[facility] ? ? ? ?CSN: [id number] ?Arrival date & time: 08/23/21  0946 ? ? ?  ? ?History   ?Chief Complaint ?Chief Complaint  ?Patient presents with  ? Cough  ?  I think i have covid or the flu. Bad cough, jittery, can't breathe when laying down - Entered by patient  ? Nasal Congestion  ? ? ?HPI ?Austin Gill is a 36 y.o. male.  ? ?HPI ? ?Patient has a cough and cold.  Heavy sensation in his chest when he tries to lie down.  He states he feels very tired and body aches.  Tremulous.  Has not noted fever or chills.  Concern for COVID.  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  Lives with his mother, she is well.  Has taken some NyQuil and over-the-counter medication ? ?Past Medical History:  ?Diagnosis Date  ? Anxiety   ? Cluster headaches   ? Depression   ? GERD (gastroesophageal reflux disease)   ? Stutter   ? ? ?Patient Active Problem List  ? Diagnosis Date Noted  ? Acute midline low back pain without sciatica 06/06/2017  ? Erectile dysfunction 06/06/2017  ? Left testicular pain 05/30/2016  ? Testicular nodule 05/30/2016  ? Overweight (BMI 25.0-29.9) 05/30/2016  ? Facial lesion 11/17/2014  ? Adjustment disorder with mixed anxiety and depressed mood 11/23/2013  ? Social anxiety disorder 11/23/2013  ? Dysthymia 11/23/2013  ? Essential tremor 09/28/2013  ? Fatty liver 12/07/2012  ? Sphenoidal sinus polyp 12/07/2012  ? Anxiety and depression 11/10/2012  ? GERD (gastroesophageal reflux disease) 11/10/2012  ? Stutter 11/10/2012  ? ? ?Past Surgical History:  ?Procedure Laterality Date  ? NO PAST SURGERIES    ? ? ? ? ? ?Home Medications   ? ?Prior to Admission medications   ?Medication Sig Start Date End Date Taking? Authorizing Provider  ?acetaminophen (TYLENOL) 325 MG tablet Take 650 mg by mouth every 6 (six) hours as needed.    [provider]  ?diphenhydrAMINE (BENADRYL) 25 MG tablet Take 25 mg by mouth every 6 (six) hours as needed.    [provider]  ?omeprazole (PRILOSEC)  40 MG capsule Take 40 mg by mouth daily.    [provider]  ? ? ?Family History ?Family History  ?Problem Relation Age of Onset  ? Migraines Mother   ? Benign prostatic hyperplasia Father   ? Parkinson's disease Father   ? Prostate cancer Paternal Grandfather   ? Prostate cancer Paternal Uncle   ? ? ?Social History ?Social History  ? ?Tobacco Use  ? Smoking status: Never  ? Smokeless tobacco: Never  ?Vaping Use  ? Vaping Use: Some days  ? Devices: 3-4 times weekly  ?Substance Use Topics  ? Alcohol use: Yes  ?  Alcohol/week: 2.0 standard drinks  ?  Types: 2 Standard drinks or equivalent per week  ?  Comment: socially  ? Drug use: No  ? ? ? ?Allergies   ?Bactrim [sulfamethoxazole-trimethoprim], Buspar [buspirone], and Paxil [paroxetine hcl] ? ? ?Review of Systems ?Review of Systems ?See HPI ? ?Physical Exam ?Triage Vital Signs ?ED Triage Vitals  ?Enc Vitals Group  ?   BP 08/23/21 0954 (!) 142/83  ?   Pulse Rate 08/23/21 0954 69  ?   Resp 08/23/21 0954 18  ?   Temp 08/23/21 0954 98.4 ?F (36.9 ?C)  ?   Temp Source 08/23/21 0954 Oral  ?   SpO2 08/23/21 0954 98 %  ?  Weight --   ?   Height --   ?   Head Circumference --   ?   Peak Flow --   ?   Pain Score 08/23/21 0955 0  ?   Pain Loc --   ?   Pain Edu? --   ?   Excl. in GC? --   ? ?No data found. ? ?Updated Vital Signs ?BP (!) 142/83 (BP Location: Right Arm)   Pulse 69   Temp 98.4 ?F (36.9 ?C) (Oral)   Resp 18   SpO2 98%  ?:    ? ?Physical Exam ?Constitutional:   ?   General: He is not in acute distress. ?   Appearance: He is well-developed. He is ill-appearing.  ?HENT:  ?   Head: Normocephalic and atraumatic.  ?   Right Ear: Tympanic membrane and ear canal normal.  ?   Left Ear: Tympanic membrane and ear canal normal.  ?   Nose: Nose normal. No rhinorrhea.  ?   Mouth/Throat:  ?   Mouth: Mucous membranes are moist.  ?   Pharynx: Posterior oropharyngeal erythema present.  ?Eyes:  ?   Conjunctiva/sclera: Conjunctivae normal.  ?   Pupils: Pupils are equal,  round, and reactive to light.  ?Cardiovascular:  ?   Rate and Rhythm: Normal rate and regular rhythm.  ?   Heart sounds: Normal heart sounds.  ?Pulmonary:  ?   Effort: Pulmonary effort is normal. No respiratory distress.  ?   Breath sounds: Normal breath sounds. No wheezing or rhonchi.  ?Abdominal:  ?   General: There is no distension.  ?   Palpations: Abdomen is soft.  ?Musculoskeletal:     ?   General: Normal range of motion.  ?   Cervical back: Normal range of motion.  ?Lymphadenopathy:  ?   Cervical: No cervical adenopathy.  ?Skin: ?   General: Skin is warm and dry.  ?Neurological:  ?   Mental Status: He is alert.  ?Psychiatric:     ?   Mood and Affect: Mood normal.     ?   Behavior: Behavior normal.  ? ? ? ?UC Treatments / Results  ?Labs ?(all labs ordered are listed, but only abnormal results are displayed) ?Labs Reviewed  ?POC SARS CORONAVIRUS 2 AG -  ED  ?NEGATIVE ? ? ?Initial Impression / Assessment and Plan / UC Course  ?I have reviewed the triage vital signs and the nursing notes. ? ?Pertinent labs & imaging results that were available during my care of the patient were reviewed by me and considered in my medical decision making (see chart for details). ? ?  ? ? ?Final Clinical Impressions(s) / UC Diagnoses  ? ?Final diagnoses:  ?Viral upper respiratory tract infection  ? ? ? ?Discharge Instructions   ? ?  ?Covid test is negative ?Drink plenty of water ?Take OTC cough and cold medicine as needed/  Delsym or mucinex DM are good for cough ? ? ? ? ? ?ED Prescriptions   ?None ?  ? ?PDMP not reviewed this encounter. ?  ?Eustace Moore, MD ?08/23/21 1035 ? ?

## 2021-09-24 ENCOUNTER — Emergency Department (INDEPENDENT_AMBULATORY_CARE_PROVIDER_SITE_OTHER)
Admission: RE | Admit: 2021-09-24 | Discharge: 2021-09-24 | Disposition: A | Discharge status: Home / Self Care | Payer: Medicare Other | Source: Ambulatory Visit | Attending: Family Medicine | Admitting: Family Medicine

## 2021-09-24 VITALS — BP 114/76 | HR 74 | Temp 98.4°F | Resp 14

## 2021-09-24 DIAGNOSIS — L237 Allergic contact dermatitis due to plants, except food: Secondary | ICD-10-CM | POA: Diagnosis not present

## 2021-09-24 MED ORDER — METHYLPREDNISOLONE ACETATE 80 MG/ML IJ SUSP
80.0000 mg | Freq: Once | INTRAMUSCULAR | Status: AC
  Administered 2021-09-24: 80 mg via INTRAMUSCULAR

## 2021-09-24 NOTE — Discharge Instructions (Signed)
Take antihistamines for the itching.  When you are awake and working use Zyrtec or Claritin.  At bedtime you can use Benadryl The shot given for the poison ivy should last several days and help to get rid of it Call or return for problems

## 2021-09-24 NOTE — ED Provider Notes (Signed)
[facility]    CSN: [id number] Arrival date & time: 09/24/21  1044      History   Chief Complaint Chief Complaint  Patient presents with   Rash    HPI Austin Gill is a 36 y.o. male.   HPI  Patient states he has poison ivy from helping his mother do yard work.  It is on his arms legs "privates".  It is very itchy. He states that starting to "spread" and itch more Past Medical History:  Diagnosis Date   Anxiety    Cluster headaches    Depression    GERD (gastroesophageal reflux disease)    Stutter     Patient Active Problem List   Diagnosis Date Noted   Acute midline low back pain without sciatica 06/06/2017   Erectile dysfunction 06/06/2017   Left testicular pain 05/30/2016   Testicular nodule 05/30/2016   Overweight (BMI 25.0-29.9) 05/30/2016   Facial lesion 11/17/2014   Adjustment disorder with mixed anxiety and depressed mood 11/23/2013   Social anxiety disorder 11/23/2013   Dysthymia 11/23/2013   Essential tremor 09/28/2013   Fatty liver 12/07/2012   Sphenoidal sinus polyp 12/07/2012   Anxiety and depression 11/10/2012   GERD (gastroesophageal reflux disease) 11/10/2012   Stutter 11/10/2012    Past Surgical History:  Procedure Laterality Date   NO PAST SURGERIES         Home Medications    Prior to Admission medications   Not on File    Family History Family History  Problem Relation Age of Onset   Migraines Mother    Benign prostatic hyperplasia Father    Parkinson's disease Father    Prostate cancer Paternal Grandfather    Prostate cancer Paternal Uncle     Social History Social History   Tobacco Use   Smoking status: Never   Smokeless tobacco: Never  Vaping Use   Vaping Use: Some days   Devices: 3-4 times weekly  Substance Use Topics   Alcohol use: Yes    Alcohol/week: 2.0 standard drinks    Types: 2 Standard drinks or equivalent per week    Comment: socially   Drug use: No     Allergies   Bactrim  [sulfamethoxazole-trimethoprim], Buspar [buspirone], and Paxil [paroxetine hcl]   Review of Systems Review of Systems See HPI  Physical Exam Triage Vital Signs ED Triage Vitals  Enc Vitals Group     BP 09/24/21 1052 114/76     Pulse Rate 09/24/21 1052 74     Resp 09/24/21 1052 14     Temp 09/24/21 1052 98.4 F (36.9 C)     Temp Source 09/24/21 1052 Oral     SpO2 09/24/21 1052 98 %     Weight --      Height --      Head Circumference --      Peak Flow --      Pain Score 09/24/21 1054 3     Pain Loc --      Pain Edu? --      Excl. in GC? --    No data found.  Updated Vital Signs BP 114/76 (BP Location: Right Arm)   Pulse 74   Temp 98.4 F (36.9 C) (Oral)   Resp 14   SpO2 98%    Physical Exam Constitutional:      General: He is not in acute distress.    Appearance: He is well-developed.  HENT:     Head: Normocephalic and atraumatic.  Eyes:     Conjunctiva/sclera: Conjunctivae normal.     Pupils: Pupils are equal, round, and reactive to light.  Cardiovascular:     Rate and Rhythm: Normal rate.  Pulmonary:     Effort: Pulmonary effort is normal. No respiratory distress.  Abdominal:     General: There is no distension.     Palpations: Abdomen is soft.  Musculoskeletal:        General: Normal range of motion.     Cervical back: Normal range of motion.  Skin:    General: Skin is warm and dry.     Comments: Scattered clusters of vesicles on erythematous base, some with soft tissue swelling  Neurological:     Mental Status: He is alert.     UC Treatments / Results  Labs (all labs ordered are listed, but only abnormal results are displayed) Labs Reviewed - No data to display  EKG   Radiology No results found.  Procedures Procedures (including critical care time)  Medications Ordered in UC Medications  methylPREDNISolone acetate (DEPO-MEDROL) injection 80 mg (80 mg Intramuscular Given 09/24/21 1112)    Initial Impression / Assessment and Plan /  UC Course  I have reviewed the triage vital signs and the nursing notes.  Pertinent labs & imaging results that were available during my care of the patient were reviewed by me and considered in my medical decision making (see chart for details).     Final Clinical Impressions(s) / UC Diagnoses   Final diagnoses:  Allergic contact dermatitis due to plants, except food     Discharge Instructions      Take antihistamines for the itching.  When you are awake and working use Zyrtec or Claritin.  At bedtime you can use Benadryl The shot given for the poison ivy should last several days and help to get rid of it Call or return for problems   ED Prescriptions   None    PDMP not reviewed this encounter.   Eustace Moore, MD 09/24/21 [phone]

## 2021-09-24 NOTE — ED Triage Notes (Signed)
Pt presents with c/o of poison ivy on arms, legs, and genitals

## 2021-10-01 ENCOUNTER — Ambulatory Visit: Payer: Medicare Other

## 2021-10-09 ENCOUNTER — Ambulatory Visit: Payer: Medicare Other

## 2021-10-10 ENCOUNTER — Emergency Department (INDEPENDENT_AMBULATORY_CARE_PROVIDER_SITE_OTHER)
Admission: RE | Admit: 2021-10-10 | Discharge: 2021-10-10 | Disposition: A | Discharge status: Home / Self Care | Payer: Medicare Other | Source: Ambulatory Visit

## 2021-10-10 VITALS — BP 118/77 | HR 65 | Temp 98.1°F | Resp 16

## 2021-10-10 DIAGNOSIS — R300 Dysuria: Secondary | ICD-10-CM | POA: Diagnosis not present

## 2021-10-10 DIAGNOSIS — L237 Allergic contact dermatitis due to plants, except food: Secondary | ICD-10-CM

## 2021-10-10 LAB — POCT URINALYSIS DIP (MANUAL ENTRY)
Bilirubin, UA: NEGATIVE
Blood, UA: NEGATIVE
Glucose, UA: NEGATIVE mg/dL
Ketones, POC UA: NEGATIVE mg/dL
Nitrite, UA: NEGATIVE
Protein Ur, POC: 30 mg/dL — AB
Spec Grav, UA: 1.015 (ref 1.010–1.025)
Urobilinogen, UA: 1 E.U./dL
pH, UA: 8.5 — AB (ref 5.0–8.0)

## 2021-10-10 MED ORDER — PREDNISONE 10 MG (21) PO TBPK: ORAL_TABLET | Freq: Every day | ORAL | 0 refills | Status: DC

## 2021-10-10 MED ORDER — METHYLPREDNISOLONE ACETATE 80 MG/ML IJ SUSP
80.0000 mg | Freq: Once | INTRAMUSCULAR | Status: AC
  Administered 2021-10-10: 80 mg via INTRAMUSCULAR

## 2021-10-10 NOTE — ED Triage Notes (Signed)
Pt here today for continued problems with rash due to poison ivy. Scratching to the point of bleeding. Says rash also spread to his genitals. Now he's started to have pain with urination x 2 days.

## 2021-10-10 NOTE — Discharge Instructions (Addendum)
Instructed patient to take medication as directed with food to completion.  Encouraged patient increase daily water intake while taking this medication.  Advised patient we will follow-up with urine culture results once received. ?

## 2021-10-10 NOTE — ED Provider Notes (Signed)
[facility]    CSN: [id number] Arrival date & time: 10/10/21  0946      History   Chief Complaint Chief Complaint  Patient presents with   Rash    Poison Ivy   Dysuria    HPI Austin Gill is a 36 y.o. male.   HPI 36 year old male presents with poison ivy dermatitis and dysuria.  Past Medical History:  Diagnosis Date   Anxiety    Cluster headaches    Depression    GERD (gastroesophageal reflux disease)    Stutter     Patient Active Problem List   Diagnosis Date Noted   Acute midline low back pain without sciatica 06/06/2017   Erectile dysfunction 06/06/2017   Left testicular pain 05/30/2016   Testicular nodule 05/30/2016   Overweight (BMI 25.0-29.9) 05/30/2016   Facial lesion 11/17/2014   Adjustment disorder with mixed anxiety and depressed mood 11/23/2013   Social anxiety disorder 11/23/2013   Dysthymia 11/23/2013   Essential tremor 09/28/2013   Fatty liver 12/07/2012   Sphenoidal sinus polyp 12/07/2012   Anxiety and depression 11/10/2012   GERD (gastroesophageal reflux disease) 11/10/2012   Stutter 11/10/2012    Past Surgical History:  Procedure Laterality Date   NO PAST SURGERIES         Home Medications    Prior to Admission medications   Medication Sig Start Date End Date Taking? Authorizing Provider  predniSONE (STERAPRED UNI-PAK 21 TAB) 10 MG (21) TBPK tablet Take by mouth daily. Take 6 tabs by mouth daily  for 2 days, then 5 tabs for 2 days, then 4 tabs for 2 days, then 3 tabs for 2 days, 2 tabs for 2 days, then 1 tab by mouth daily for 2 days 10/10/21  Yes Trevor Ihaagan, Mariell Nester, FNP    Family History Family History  Problem Relation Age of Onset   Migraines Mother    Benign prostatic hyperplasia Father    Parkinson's disease Father    Prostate cancer Paternal Grandfather    Prostate cancer Paternal Uncle     Social History Social History   Tobacco Use   Smoking status: Never   Smokeless tobacco: Never  Vaping Use   Vaping  Use: Some days   Devices: 3-4 times weekly  Substance Use Topics   Alcohol use: Yes    Alcohol/week: 2.0 standard drinks    Types: 2 Standard drinks or equivalent per week    Comment: socially   Drug use: No     Allergies   Bactrim [sulfamethoxazole-trimethoprim], Buspar [buspirone], and Paxil [paroxetine hcl]   Review of Systems Review of Systems   Physical Exam Triage Vital Signs ED Triage Vitals  Enc Vitals Group     BP      Pulse      Resp      Temp      Temp src      SpO2      Weight      Height      Head Circumference      Peak Flow      Pain Score      Pain Loc      Pain Edu?      Excl. in GC?    No data found.  Updated Vital Signs BP 118/77 (BP Location: Left Arm)   Pulse 65   Temp 98.1 F (36.7 C) (Oral)   Resp 16   SpO2 97%      Physical Exam Vitals and nursing note  reviewed.  Constitutional:      General: He is not in acute distress.    Appearance: Normal appearance. He is normal weight. He is not ill-appearing.  HENT:     Head: Normocephalic and atraumatic.     Mouth/Throat:     Mouth: Mucous membranes are moist.     Pharynx: Oropharynx is clear.  Eyes:     Conjunctiva/sclera: Conjunctivae normal.     Pupils: Pupils are equal, round, and reactive to light.  Cardiovascular:     Rate and Rhythm: Normal rate and regular rhythm.     Pulses: Normal pulses.     Heart sounds: Normal heart sounds.  Pulmonary:     Effort: Pulmonary effort is normal.     Breath sounds: Normal breath sounds. No wheezing, rhonchi or rales.  Abdominal:     Tenderness: There is no right CVA tenderness or left CVA tenderness.  Musculoskeletal:     Cervical back: Normal range of motion and neck supple.  Skin:    General: Skin is warm and dry.     Comments: Upper/lower arms/upper legs: Erythematous pruritic maculopapular eruption with crusted linear vesicular lesions noted  Neurological:     General: No focal deficit present.     Mental Status: He is alert and  oriented to person, place, and time. Mental status is at baseline.     UC Treatments / Results  Labs (all labs ordered are listed, but only abnormal results are displayed) Labs Reviewed  POCT URINALYSIS DIP (MANUAL ENTRY) - Abnormal; Notable for the following components:      Result Value   pH, UA 8.5 (*)    Protein Ur, POC =30 (*)    Leukocytes, UA Small (1+) (*)    All other components within normal limits  URINE CULTURE    EKG   Radiology No results found.  Procedures Procedures (including critical care time)  Medications Ordered in UC Medications  methylPREDNISolone acetate (DEPO-MEDROL) injection 80 mg (80 mg Intramuscular Given 10/10/21 1029)    Initial Impression / Assessment and Plan / UC Course  I have reviewed the triage vital signs and the nursing notes.  Pertinent labs & imaging results that were available during my care of the patient were reviewed by me and considered in my medical decision making (see chart for details).     MDM: 1.  Poison ivy dermatitis-IM Depo-Medrol 80 mg given once in clinic prior to discharge, Rx'd Sterapred; 2.  Dysuria-urine culture ordered. Instructed patient to take medication as directed with food to completion.  Encouraged patient increase daily water intake while taking this medication.  Advised patient we will follow-up with urine culture results once received.  Work note provided to patient prior to discharge.  Patient discharged home, hemodynamically stable. Final Clinical Impressions(s) / UC Diagnoses   Final diagnoses:  Poison ivy dermatitis  Dysuria     Discharge Instructions      Instructed patient to take medication as directed with food to completion.  Encouraged patient increase daily water intake while taking this medication.  Advised patient we will follow-up with urine culture results once received.     ED Prescriptions     Medication Sig Dispense Auth. Provider   predniSONE (STERAPRED UNI-PAK 21 TAB) 10  MG (21) TBPK tablet Take by mouth daily. Take 6 tabs by mouth daily  for 2 days, then 5 tabs for 2 days, then 4 tabs for 2 days, then 3 tabs for 2 days, 2 tabs for 2  days, then 1 tab by mouth daily for 2 days 42 tablet Trevor Iha, FNP      PDMP not reviewed this encounter.   Trevor Iha, FNP 10/10/21 1041

## 2021-10-11 LAB — URINE CULTURE
MICRO NUMBER:: 13495070
Result:: NO GROWTH
SPECIMEN QUALITY:: ADEQUATE

## 2021-10-19 ENCOUNTER — Ambulatory Visit: Payer: Medicare Other

## 2021-10-20 ENCOUNTER — Ambulatory Visit
Admission: RE | Admit: 2021-10-20 | Discharge: 2021-10-20 | Disposition: A | Discharge status: Home / Self Care | Payer: Medicare Other | Source: Ambulatory Visit | Attending: Emergency Medicine | Admitting: Emergency Medicine

## 2021-10-20 VITALS — BP 126/84 | HR 64 | Temp 98.6°F | Resp 20

## 2021-10-20 DIAGNOSIS — M62838 Other muscle spasm: Secondary | ICD-10-CM

## 2021-10-20 DIAGNOSIS — E870 Hyperosmolality and hypernatremia: Secondary | ICD-10-CM

## 2021-10-20 DIAGNOSIS — L237 Allergic contact dermatitis due to plants, except food: Secondary | ICD-10-CM

## 2021-10-20 DIAGNOSIS — R6889 Other general symptoms and signs: Secondary | ICD-10-CM

## 2021-10-20 LAB — POCT URINALYSIS DIP (MANUAL ENTRY)
Bilirubin, UA: NEGATIVE
Blood, UA: NEGATIVE
Glucose, UA: NEGATIVE mg/dL
Ketones, POC UA: NEGATIVE mg/dL
Leukocytes, UA: NEGATIVE
Nitrite, UA: NEGATIVE
Protein Ur, POC: NEGATIVE mg/dL
Spec Grav, UA: 1.015 (ref 1.010–1.025)
Urobilinogen, UA: 0.2 E.U./dL
pH, UA: 7 (ref 5.0–8.0)

## 2021-10-20 MED ORDER — IBUPROFEN 600 MG PO TABS: 600.0000 mg | ORAL_TABLET | Freq: Three times a day (TID) | ORAL | 0 refills | Status: DC | PRN

## 2021-10-20 MED ORDER — DICLOFENAC SODIUM 1 % EX GEL: 4.0000 g | Freq: Four times a day (QID) | CUTANEOUS | 2 refills | Status: DC

## 2021-10-20 MED ORDER — BACLOFEN 10 MG PO TABS: 10.0000 mg | ORAL_TABLET | Freq: Three times a day (TID) | ORAL | 0 refills | Status: AC

## 2021-10-20 MED ORDER — TRIAMCINOLONE ACETONIDE 0.1 % EX CREA: 1.0000 | TOPICAL_CREAM | Freq: Two times a day (BID) | CUTANEOUS | 0 refills | Status: DC

## 2021-10-20 MED ORDER — METHYLPREDNISOLONE 4 MG PO TBPK: ORAL_TABLET | ORAL | 0 refills | Status: DC

## 2021-10-20 NOTE — Discharge Instructions (Addendum)
The mainstay of therapy for musculoskeletal pain is reduction of inflammation and relaxation of tension which is causing inflammation.  Keep in mind, pain always begets more pain.  To help you stay ahead of your pain and inflammation, I have provided the following regimen for you:   Begin taking baclofen 10 mg.  This is a highly effective muscle relaxer and antispasmodic which should continue to provide you with relaxation of your tense muscles, allow you to sleep well and to keep your pain under control.  You can continue taking this medication 3 times daily as you need to.  If you find that this medication makes you too sleepy during your waking hours, you can break them in half for your "daytime" doses and, if needed double them for your "nighttime" dose.  Do not take more than 30 mg of baclofen in a 24-hour period.   Tomorrow morning, please begin taking methylprednisolone.  Please take 1 full row tablets at once with your breakfast meal.  If you have had significant resolution of your pain before you finish the entire prescription, please feel free to discontinue.  It is not important to finish every dose.  If you find that methylprednisolone also makes you feel nauseated like the prednisone did, please feel free to switch to ibuprofen 600 mg tablets 3 times daily.   During the day, please set aside time to apply ice to the affected area 4 times daily for 20 minutes each application.  This can be achieved by using a bag of frozen peas or corn, a Ziploc bag filled with ice and water, or Ziploc bag filled with half rubbing alcohol and half Dawn dish detergent, frozen into a slush.  Please be careful not to apply ice directly to your skin, always place a soft cloth between you and the ice pack.   You are welcome to use topical anti-inflammatory creams such as Voltaren gel, capsaicin or Aspercreme as recommended.  These medications are available over-the-counter, please follow manufactures instructions  for use.  As a courtesy, I provided you with a prescription for diclofenac in the event that your insurance will pay for this.   Please avoid attempts to stretch or strengthen the affected area until you are feeling completely pain-free.  Attempts to do so will only prolong the healing process.     I also recommend that you remain out of work for the next several days, I provided you with a note to return to work in 3 days.  If you feel that you need this time extended, please follow-up with your primary care provider or return to urgent care for reevaluation so that we can provide you with a note for another 3 days.  Please watch your sodium intake, remember that the daily recommended intake is 2300 mg/day which amounts to roughly a teaspoon of salt.  If you can decrease your daytime consumption of salt, you should be able to decrease your nighttime frequency of urination.  I sent prescription for triamcinolone cream to your pharmacy to help resolve the remaining lesions from your extensive poison ivy exposure. Thank you for visiting urgent care today.  We appreciate the opportunity to participate in your care.

## 2021-10-20 NOTE — ED Triage Notes (Signed)
Pt here with neck pain x 3 days and urinary issues including painful urination and increased night time frequency x 2 weeks. Pt also requests some cream for improving poison ivy.

## 2021-10-20 NOTE — ED Provider Notes (Signed)
[facility]    CSN: [id number] Arrival date & time: 10/20/21  0908    HISTORY   Chief Complaint  Patient presents with   Urinary Frequency    Pain when urinating and also wake up frequently at night to urinate. Also back and neck pain and back spasms - Entered by patient   Neck Pain   HPI Austin Gill is a 36 y.o. male. Patient presents to urgent care complaining of 3 increased frequency of urination at nighttime, reports being awakened to urinate 5-6 times for the past 3 nights to urinate, reports a significant volume of urine voided with each episode in the urine being dark and strong in odor.  Patient reports a high sodium diet, states, "I still eat like a 45 year old kid".  Patient reports increased swelling of his lower extremities occasionally but states it is not persistent.  Patient also complains of pain at the base of his neck on both sides radiating up to his shoulders.  Patient states he is particularly sore he wakes up in the morning, sometimes with decreased range of motion secondary to pain in his neck on both sides.  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  Denies increased stress at work but states he has noticed that pain is worse when he has been working a full shift.  Patient denies fever, aches, chills, headache, numbness tingling in his upper extremities  Patient also states he was unable to tolerate the full course of prednisone secondary to nausea that he was prescribed for his poison ivy.  Patient states he still has some residual rash on his skin and is requesting topical treatment for this.  The history is provided by the patient.   Past Medical History:  Diagnosis Date   Anxiety    Cluster headaches    Depression    GERD (gastroesophageal reflux disease)    Stutter    Patient Active Problem List   Diagnosis Date Noted   Acute midline low back pain without sciatica 06/06/2017   Erectile dysfunction 06/06/2017    Left testicular pain 05/30/2016   Testicular nodule 05/30/2016   Overweight (BMI 25.0-29.9) 05/30/2016   Facial lesion 11/17/2014   Adjustment disorder with mixed anxiety and depressed mood 11/23/2013   Social anxiety disorder 11/23/2013   Dysthymia 11/23/2013   Essential tremor 09/28/2013   Fatty liver 12/07/2012   Sphenoidal sinus polyp 12/07/2012   Anxiety and depression 11/10/2012   GERD (gastroesophageal reflux disease) 11/10/2012   Stutter 11/10/2012   Past Surgical History:  Procedure Laterality Date   NO PAST SURGERIES      Home Medications    Prior to Admission medications   Medication Sig Start Date End Date Taking? Authorizing Provider  predniSONE (STERAPRED UNI-PAK 21 TAB) 10 MG (21) TBPK tablet Take by mouth daily. Take 6 tabs by mouth daily  for 2 days, then 5 tabs for 2 days, then 4 tabs for 2 days, then 3 tabs for 2 days, 2 tabs for 2 days, then 1 tab by mouth daily for 2 days 10/10/21   Eliezer Lofts, FNP    Family History Family History  Problem Relation Age of Onset   Migraines Mother    Benign prostatic hyperplasia Father    Parkinson's disease Father    Prostate cancer Paternal Grandfather    Prostate cancer Paternal Uncle    Social History Social History   Tobacco Use   Smoking status: Never   Smokeless tobacco:  Never  Vaping Use   Vaping Use: Some days   Devices: 3-4 times weekly  Substance Use Topics   Alcohol use: Yes    Alcohol/week: 2.0 standard drinks of alcohol    Types: 2 Standard drinks or equivalent per week    Comment: socially   Drug use: No   Allergies   Bactrim [sulfamethoxazole-trimethoprim], Buspar [buspirone], and Paxil [paroxetine hcl]  Review of Systems Review of Systems Pertinent findings noted in history of present illness.   Physical Exam Triage Vital Signs ED Triage Vitals  Enc Vitals Group     BP 03/02/21 0827 (!) 147/82     Pulse Rate 03/02/21 0827 72     Resp 03/02/21 0827 18     Temp 03/02/21 0827 98.3 F  (36.8 C)     Temp Source 03/02/21 0827 Oral     SpO2 03/02/21 0827 98 %     Weight --      Height --      Head Circumference --      Peak Flow --      Pain Score 03/02/21 0826 5     Pain Loc --      Pain Edu? --      Excl. in GC? --   No data found.  Updated Vital Signs BP 126/84   Pulse 64   Temp 98.6 F (37 C)   Resp 20   SpO2 98%   Physical Exam Vitals and nursing note reviewed.  Constitutional:      General: He is not in acute distress.    Appearance: Normal appearance. He is not ill-appearing.  HENT:     Head: Normocephalic and atraumatic.  Eyes:     General: Lids are normal.        Right eye: No discharge.        Left eye: No discharge.     Extraocular Movements: Extraocular movements intact.     Conjunctiva/sclera: Conjunctivae normal.     Right eye: Right conjunctiva is not injected.     Left eye: Left conjunctiva is not injected.  Neck:     Trachea: Trachea and phonation normal.  Cardiovascular:     Rate and Rhythm: Normal rate and regular rhythm.     Pulses: Normal pulses.     Heart sounds: Normal heart sounds. No murmur heard.    No friction rub. No gallop.  Pulmonary:     Effort: Pulmonary effort is normal. No accessory muscle usage, prolonged expiration or respiratory distress.     Breath sounds: Normal breath sounds. No stridor, decreased air movement or transmitted upper airway sounds. No decreased breath sounds, wheezing, rhonchi or rales.  Chest:     Chest [place]: No tenderness.  Musculoskeletal:        General: Tenderness (Spasm and TTP bilateral upper trapezius and SCM) present. No swelling, deformity or signs of injury. Normal range of motion.     Cervical back: Normal range of motion and neck supple. Normal range of motion.     Right lower leg: No edema.     Left lower leg: No edema.  Lymphadenopathy:     Cervical: No cervical adenopathy.  Skin:    General: Skin is warm and dry.     Findings: Rash (Scattered, resolving areas of atopic  dermatitis present on both arms and legs.) present. No erythema.  Neurological:     General: No focal deficit present.     Mental Status: He is alert and oriented to  person, place, and time.  Psychiatric:        Mood and Affect: Mood normal.        Behavior: Behavior normal.     Visual Acuity Right Eye Distance:   Left Eye Distance:   Bilateral Distance:    Right Eye Near:   Left Eye Near:    Bilateral Near:     UC Couse / Diagnostics / Procedures:    EKG  Radiology No results found.  Procedures Procedures (including critical care time)  UC Diagnoses / Final Clinical Impressions(s)   I have reviewed the triage vital signs and the nursing notes.  Pertinent labs & imaging results that were available during my care of the patient were reviewed by me and considered in my medical decision making (see chart for details).    Final diagnoses:  Cervical paraspinous muscle spasm  Excessive sodium intake  Poison ivy dermatitis   Patient politely declined offer for trigger point injection during visit today.  Patient was agreeable to try a different steroid, methylprednisolone, to relieve inflammation in his neck.  Patient was agreeable to baclofen 3 times daily for muscle spasm.  Provided with Voltaren gel and ibuprofen 600 mg for intermittent relief after the acute episode is resolved.  Patient advised to apply ice to sore muscles and neck 3-4 times daily.  Patient provided with triamcinolone topical steroid for rash.  Return precautions advised.  ED Prescriptions     Medication Sig Dispense Auth. Provider   methylPREDNISolone (MEDROL DOSEPAK) 4 MG TBPK tablet Take 24 mg on day 1, 20 mg on day 2, 16 mg on day 3, 12 mg on day 4, 8 mg on day 5, 4 mg on day 6.  Take all tablets in each row at once, do not spread tablets out throughout the day. 21 tablet Theadora Rama Scales, PA-C   baclofen (LIORESAL) 10 MG tablet Take 1 tablet (10 mg total) by mouth 3 (three) times daily for 7  days. 21 tablet Theadora Rama Scales, PA-C   ibuprofen (ADVIL) 600 MG tablet Take 1 tablet (600 mg total) by mouth every 8 (eight) hours as needed for up to 30 doses for fever, headache, mild pain or moderate pain (Inflammation). Take 1 tablet 3 times daily as needed for inflammation of upper airways and/or pain.  Do not take this medication at the same time as methylprednisolone. 30 tablet Theadora Rama Scales, PA-C   diclofenac Sodium (VOLTAREN) 1 % GEL Apply 4 g topically 4 (four) times daily. Apply to affected areas 4 times daily as needed for pain. 100 g Theadora Rama Scales, PA-C   triamcinolone cream (KENALOG) 0.1 % Apply 1 Application topically 2 (two) times daily. Apply to affected area(s) twice daily , do not apply to face. 30 g Theadora Rama Scales, PA-C      PDMP not reviewed this encounter.  Pending results:  Labs Reviewed  POCT URINALYSIS DIP (MANUAL ENTRY)    Medications Ordered in UC: Medications - No data to display  Disposition Upon Discharge:  Condition: stable for discharge home Home: take medications as prescribed; routine discharge instructions as discussed; follow up as advised.  Patient presented with an acute illness with associated systemic symptoms and significant discomfort requiring urgent management. In my opinion, this is a condition that a prudent lay person (someone who possesses an average knowledge of health and medicine) may potentially expect to result in complications if not addressed urgently such as respiratory distress, impairment of bodily function or dysfunction of  bodily organs.   Routine symptom specific, illness specific and/or disease specific instructions were discussed with the patient and/or caregiver at length.   As such, the patient has been evaluated and assessed, work-up was performed and treatment was provided in alignment with urgent care protocols and evidence based medicine.  Patient/parent/caregiver has been advised that the  patient may require follow up for further testing and treatment if the symptoms continue in spite of treatment, as clinically indicated and appropriate.  If the patient was tested for COVID-19, Influenza and/or RSV, then the patient/parent/guardian was advised to isolate at home pending the results of his/her diagnostic coronavirus test and potentially longer if they're positive. I have also advised pt that if his/her COVID-19 test returns positive, it's recommended to self-isolate for at least 10 days after symptoms first appeared AND until fever-free for 24 hours without fever reducer AND other symptoms have improved or resolved. Discussed self-isolation recommendations as well as instructions for household member/close contacts as per the [facility] and [place] DHHS, and also gave patient the [place] packet with this information.  Patient/parent/caregiver has been advised to return to the [facility] or PCP in 3-5 days if no better; to PCP or the Emergency Department if new signs and symptoms develop, or if the current signs or symptoms continue to change or worsen for further workup, evaluation and treatment as clinically indicated and appropriate  The patient will follow up with their current PCP if and as advised. If the patient does not currently have a PCP we will assist them in obtaining one.   The patient may need specialty follow up if the symptoms continue, in spite of conservative treatment and management, for further workup, evaluation, consultation and treatment as clinically indicated and appropriate.   Patient/parent/caregiver verbalized understanding and agreement of plan as discussed.  All questions were addressed during visit.  Please see discharge instructions below for further details of plan.  Discharge Instructions:   Discharge Instructions      The mainstay of therapy for musculoskeletal pain is reduction of inflammation and relaxation of tension which is causing inflammation.  Keep in mind,  pain always begets more pain.  To help you stay ahead of your pain and inflammation, I have provided the following regimen for you:   Begin taking baclofen 10 mg.  This is a highly effective muscle relaxer and antispasmodic which should continue to provide you with relaxation of your tense muscles, allow you to sleep well and to keep your pain under control.  You can continue taking this medication 3 times daily as you need to.  If you find that this medication makes you too sleepy during your waking hours, you can break them in half for your "daytime" doses and, if needed double them for your "nighttime" dose.  Do not take more than 30 mg of baclofen in a 24-hour period.   Tomorrow morning, please begin taking methylprednisolone.  Please take 1 full row tablets at once with your breakfast meal.  If you have had significant resolution of your pain before you finish the entire prescription, please feel free to discontinue.  It is not important to finish every dose.  If you find that methylprednisolone also makes you feel nauseated like the prednisone did, please feel free to switch to ibuprofen 600 mg tablets 3 times daily.   During the day, please set aside time to apply ice to the affected area 4 times daily for 20 minutes each application.  This can be achieved by  using a bag of frozen peas or corn, a Ziploc bag filled with ice and water, or Ziploc bag filled with half rubbing alcohol and half Dawn dish detergent, frozen into a slush.  Please be careful not to apply ice directly to your skin, always place a soft cloth between you and the ice pack.   You are welcome to use topical anti-inflammatory creams such as Voltaren gel, capsaicin or Aspercreme as recommended.  These medications are available over-the-counter, please follow manufactures instructions for use.  As a courtesy, I provided you with a prescription for diclofenac in the event that your insurance will pay for this.   Please avoid attempts  to stretch or strengthen the affected area until you are feeling completely pain-free.  Attempts to do so will only prolong the healing process.     I also recommend that you remain out of work for the next several days, I provided you with a note to return to work in 3 days.  If you feel that you need this time extended, please follow-up with your primary care provider or return to urgent care for reevaluation so that we can provide you with a note for another 3 days.  Please watch your sodium intake, remember that the daily recommended intake is 2300 mg/day which amounts to roughly a teaspoon of salt.  If you can decrease your daytime consumption of salt, you should be able to decrease your nighttime frequency of urination.  I sent prescription for triamcinolone cream to your pharmacy to help resolve the remaining lesions from your extensive poison ivy exposure. Thank you for visiting urgent care today.  We appreciate the opportunity to participate in your care.       This office note has been dictated using Museum/gallery curator.  Unfortunately, and despite my best efforts, this method of dictation can sometimes lead to occasional typographical or grammatical errors.  I apologize in advance if this occurs.     Lynden Oxford Scales, PA-C 10/20/21 1001

## 2021-10-29 ENCOUNTER — Telehealth: Payer: Self-pay

## 2021-12-31 ENCOUNTER — Ambulatory Visit: Payer: Medicare Other | Admitting: Medical-Surgical

## 2022-01-09 ENCOUNTER — Ambulatory Visit: Payer: Medicare Other

## 2022-01-10 ENCOUNTER — Ambulatory Visit: Payer: Medicare Other

## 2022-01-10 ENCOUNTER — Ambulatory Visit
Admission: RE | Admit: 2022-01-10 | Discharge: 2022-01-10 | Disposition: A | Discharge status: Home / Self Care | Payer: Medicare Other | Source: Ambulatory Visit | Attending: Family Medicine | Admitting: Family Medicine

## 2022-01-10 VITALS — BP 128/83 | HR 66 | Temp 98.4°F | Resp 14

## 2022-01-10 DIAGNOSIS — Z202 Contact with and (suspected) exposure to infections with a predominantly sexual mode of transmission: Secondary | ICD-10-CM | POA: Diagnosis not present

## 2022-01-10 DIAGNOSIS — R300 Dysuria: Secondary | ICD-10-CM | POA: Insufficient documentation

## 2022-01-10 DIAGNOSIS — R369 Urethral discharge, unspecified: Secondary | ICD-10-CM | POA: Diagnosis not present

## 2022-01-10 DIAGNOSIS — Z792 Long term (current) use of antibiotics: Secondary | ICD-10-CM | POA: Insufficient documentation

## 2022-01-10 DIAGNOSIS — R350 Frequency of micturition: Secondary | ICD-10-CM | POA: Diagnosis not present

## 2022-01-10 LAB — POCT URINALYSIS DIP (MANUAL ENTRY)
Bilirubin, UA: NEGATIVE
Blood, UA: NEGATIVE
Glucose, UA: NEGATIVE mg/dL
Ketones, POC UA: NEGATIVE mg/dL
Nitrite, UA: NEGATIVE
Protein Ur, POC: 30 mg/dL — AB
Spec Grav, UA: 1.025 (ref 1.010–1.025)
Urobilinogen, UA: 1 E.U./dL
pH, UA: 7 (ref 5.0–8.0)

## 2022-01-10 MED ORDER — CEFTRIAXONE SODIUM 500 MG IJ SOLR
500.0000 mg | Freq: Once | INTRAMUSCULAR | Status: AC
  Administered 2022-01-10: 500 mg via INTRAMUSCULAR

## 2022-01-10 NOTE — ED Provider Notes (Signed)
[facility]    CSN: [id number] Arrival date & time: 01/10/22  1355      History   Chief Complaint Chief Complaint  Patient presents with   Penile Discharge   Dysuria    HPI Austin Gill is a 36 y.o. male.   Patient requests STD testing.  He has had dysuria and penile discharge for 3 days.  He denies testicular pain, abdominal pain, fevers, chills, and sweats, and rash and feels well otherwise.  The history is provided by the patient.    Past Medical History:  Diagnosis Date   Anxiety    Cluster headaches    Depression    GERD (gastroesophageal reflux disease)    Stutter     Patient Active Problem List   Diagnosis Date Noted   Acute midline low back pain without sciatica 06/06/2017   Erectile dysfunction 06/06/2017   Left testicular pain 05/30/2016   Testicular nodule 05/30/2016   Overweight (BMI 25.0-29.9) 05/30/2016   Facial lesion 11/17/2014   Adjustment disorder with mixed anxiety and depressed mood 11/23/2013   Social anxiety disorder 11/23/2013   Dysthymia 11/23/2013   Essential tremor 09/28/2013   Fatty liver 12/07/2012   Sphenoidal sinus polyp 12/07/2012   Anxiety and depression 11/10/2012   GERD (gastroesophageal reflux disease) 11/10/2012   Stutter 11/10/2012    Past Surgical History:  Procedure Laterality Date   NO PAST SURGERIES         Home Medications    Prior to Admission medications   Medication Sig Start Date End Date Taking? Authorizing Provider  diclofenac Sodium (VOLTAREN) 1 % GEL Apply 4 g topically 4 (four) times daily. Apply to affected areas 4 times daily as needed for pain. Patient not taking: Reported on 01/10/2022 10/20/21   Theadora Rama Scales, PA-C  ibuprofen (ADVIL) 600 MG tablet Take 1 tablet (600 mg total) by mouth every 8 (eight) hours as needed for up to 30 doses for fever, headache, mild pain or moderate pain (Inflammation). Take 1 tablet 3 times daily as needed for inflammation of upper airways and/or  pain.  Do not take this medication at the same time as methylprednisolone. Patient not taking: Reported on 01/10/2022 10/20/21   Theadora Rama Scales, PA-C  methylPREDNISolone (MEDROL DOSEPAK) 4 MG TBPK tablet Take 24 mg on day 1, 20 mg on day 2, 16 mg on day 3, 12 mg on day 4, 8 mg on day 5, 4 mg on day 6.  Take all tablets in each row at once, do not spread tablets out throughout the day. Patient not taking: Reported on 01/10/2022 10/20/21   Theadora Rama Scales, PA-C  triamcinolone cream (KENALOG) 0.1 % Apply 1 Application topically 2 (two) times daily. Apply to affected area(s) twice daily , do not apply to face. Patient not taking: Reported on 01/10/2022 10/20/21   Theadora Rama Scales, PA-C    Family History Family History  Problem Relation Age of Onset   Migraines Mother    Benign prostatic hyperplasia Father    Parkinson's disease Father    Prostate cancer Paternal Grandfather    Prostate cancer Paternal Uncle     Social History Social History   Tobacco Use   Smoking status: Never   Smokeless tobacco: Never  Vaping Use   Vaping Use: Some days   Devices: 3-4 times weekly  Substance Use Topics   Alcohol use: Yes    Alcohol/week: 2.0 standard drinks of alcohol    Types: 2 Standard drinks or  equivalent per week    Comment: socially   Drug use: No     Allergies   Bactrim [sulfamethoxazole-trimethoprim], Buspar [buspirone], and Paxil [paroxetine hcl]   Review of Systems Review of Systems  Constitutional:  Negative for activity change, chills, diaphoresis, fatigue and fever.  HENT:  Negative for sore throat.   Gastrointestinal:  Negative for abdominal pain.  Genitourinary:  Positive for dysuria and penile discharge. Negative for flank pain, frequency, genital sores, hematuria, penile pain, penile swelling, scrotal swelling, testicular pain and urgency.  Musculoskeletal:  Negative for arthralgias.  Skin:  Negative for rash.  All other systems reviewed and are  negative.    Physical Exam Triage Vital Signs ED Triage Vitals  Enc Vitals Group     BP 01/10/22 1415 128/83     Pulse Rate 01/10/22 1415 66     Resp 01/10/22 1415 14     Temp 01/10/22 1415 98.4 F (36.9 C)     Temp Source 01/10/22 1415 Oral     SpO2 01/10/22 1415 98 %     Weight --      Height --      Head Circumference --      Peak Flow --      Pain Score 01/10/22 1417 0     Pain Loc --      Pain Edu? --      Excl. in GC? --    No data found.  Updated Vital Signs BP 128/83 (BP Location: Right Arm)   Pulse 66   Temp 98.4 F (36.9 C) (Oral)   Resp 14   SpO2 98%   Visual Acuity Right Eye Distance:   Left Eye Distance:   Bilateral Distance:    Right Eye Near:   Left Eye Near:    Bilateral Near:     Physical Exam Vitals and nursing note reviewed.  Constitutional:      General: He is not in acute distress. HENT:     Mouth/Throat:     Pharynx: Oropharynx is clear.  Eyes:     Pupils: Pupils are equal, round, and reactive to light.  Cardiovascular:     Rate and Rhythm: Normal rate.  Pulmonary:     Effort: Pulmonary effort is normal.  Skin:    General: Skin is warm and dry.     Findings: No rash.  Neurological:     Mental Status: He is alert and oriented to person, place, and time.      UC Treatments / Results  Labs (all labs ordered are listed, but only abnormal results are displayed) Labs Reviewed  POCT URINALYSIS DIP (MANUAL ENTRY) - Abnormal; Notable for the following components:      Result Value   Color, UA straw (*)    Clarity, UA cloudy (*)    Protein Ur, POC =30 (*)    Leukocytes, UA Moderate (2+) (*)    All other components within normal limits  URINE CULTURE  HIV ANTIBODY (ROUTINE TESTING W REFLEX)  RPR  CYTOLOGY, (ORAL, ANAL, URETHRAL) ANCILLARY ONLY    EKG   Radiology No results found.  Procedures Procedures (including critical care time)  Medications Ordered in UC Medications  cefTRIAXone (ROCEPHIN) injection 500 mg  (500 mg Intramuscular Given 01/10/22 1522)    Initial Impression / Assessment and Plan / UC Course  I have reviewed the triage vital signs and the nursing notes.  Pertinent labs & imaging results that were available during my care of the patient were  reviewed by me and considered in my medical decision making (see chart for details).    Administered Rocephin 500mg  IM. Cytology ancillary, HIV, RPR pending. If chlamydia positive, patient prefers azithromycin (500mg  PO) because of GI intolerance with doxycycline. Followup with Family Doctor if not improved in one week.    Final Clinical Impressions(s) / UC Diagnoses   Final diagnoses:  Urethral discharge in male  Possible exposure to STD  Urinary frequency     Discharge Instructions      Avoid sexual contact until negative results of all tests are available.    ED Prescriptions   None       , MD 01/12/22 1348

## 2022-01-10 NOTE — Discharge Instructions (Signed)
Avoid sexual contact until negative results of all tests are available.

## 2022-01-10 NOTE — ED Triage Notes (Signed)
Pt presents with c/o dysuria and penile discharge x 3 days.

## 2022-01-11 LAB — URINE CULTURE: Culture: NO GROWTH

## 2022-01-11 LAB — RPR: RPR Ser Ql: NONREACTIVE

## 2022-01-11 LAB — HIV ANTIBODY (ROUTINE TESTING W REFLEX): HIV 1&2 Ab, 4th Generation: NONREACTIVE

## 2022-01-15 LAB — CYTOLOGY, (ORAL, ANAL, URETHRAL) ANCILLARY ONLY
Chlamydia: NEGATIVE
Comment: NEGATIVE
Comment: NEGATIVE
Comment: NORMAL
Neisseria Gonorrhea: POSITIVE — AB
Trichomonas: NEGATIVE

## 2022-07-11 ENCOUNTER — Ambulatory Visit
Admission: RE | Admit: 2022-07-11 | Discharge: 2022-07-11 | Disposition: A | Discharge status: Home / Self Care | Payer: Medicare Other | Source: Ambulatory Visit | Attending: Internal Medicine | Admitting: Internal Medicine

## 2022-07-11 VITALS — BP 118/73 | HR 71 | Temp 98.1°F | Resp 16 | Ht 68.0 in | Wt 200.0 lb

## 2022-07-11 DIAGNOSIS — L237 Allergic contact dermatitis due to plants, except food: Secondary | ICD-10-CM

## 2022-07-11 MED ORDER — CETIRIZINE HCL 10 MG PO TABS: 10.0000 mg | ORAL_TABLET | Freq: Every day | ORAL | 0 refills | Status: DC | PRN

## 2022-07-11 MED ORDER — METHYLPREDNISOLONE 4 MG PO TBPK: ORAL_TABLET | ORAL | 0 refills | Status: DC

## 2022-07-11 NOTE — ED Triage Notes (Signed)
Patient c/o poison ivy x 1 week.  Patient does have a rash on both arms and legs.  Very itchy.  Patient has taken Benadryl and Ibuprofen.

## 2022-07-11 NOTE — ED Provider Notes (Signed)
[place]  Note:  This document was prepared using Dragon voice recognition software and may include unintentional dictation errors.  MRN: [id number] DOB: 24-Apr-1986 DATE: 07/11/22   Subjective:  Chief Complaint: Rash    HPI: Austin Gill is a 37 y.o. male presenting for pruritic rash on his arms and lower extremities for the past week.  States he was out picking up sticks in his yard over the weekend just prior to the rash starting.  He has a known allergy to poison ivy.  States this feels similar to rashes in the past.  Denies fever, shortness of breath, difficulty breathing, and discharge. Endorses pruritus. Presents NAD.  Prior to Admission medications   Medication Sig Start Date End Date Taking? Authorizing Provider  cetirizine (ZYRTEC ALLERGY) 10 MG tablet Take 1 tablet (10 mg total) by mouth daily as needed (itching). 07/11/22  Yes Zyheir Daft P, PA-C  methylPREDNISolone (MEDROL DOSEPAK) 4 MG TBPK tablet Take 24 mg on day 1, 20 mg on day 2, 16 mg on day 3, 12 mg on day 4, 8 mg on day 5, 4 mg on day 6.  Take all tablets in each row at once, do not spread tablets out throughout the day. 07/11/22  Yes Zameria Vogl P, PA-C     Allergies  Allergen Reactions   Bactrim [Sulfamethoxazole-Trimethoprim] Other (See Comments)    Chest pain   Buspar [Buspirone]     Nausea   Paxil [Paroxetine Hcl]     Suicidal thoughts    History:   Past Medical History:  Diagnosis Date   Anxiety    Cluster headaches    Depression    GERD (gastroesophageal reflux disease)    Stutter      Past Surgical History:  Procedure Laterality Date   NO PAST SURGERIES      Family History  Problem Relation Age of Onset   Migraines Mother    Benign prostatic hyperplasia Father    Parkinson's disease Father    Prostate cancer Paternal Grandfather    Prostate cancer Paternal Uncle     Social History   Tobacco Use   Smoking status: Never   Smokeless tobacco: Never   Vaping Use   Vaping Use: Some days   Devices: 3-4 times weekly  Substance Use Topics   Alcohol use: Yes    Alcohol/week: 2.0 standard drinks of alcohol    Types: 2 Standard drinks or equivalent per week    Comment: socially   Drug use: No    Review of Systems  Constitutional:  Negative for fever.  Respiratory:  Negative for shortness of breath.   Skin:  Positive for rash.  Allergic/Immunologic: Positive for environmental allergies.     Objective:   Vitals: BP 118/73 (BP Location: Right Arm)   Pulse 71   Temp 98.1 F (36.7 C) (Oral)   Resp 16   Ht '5\' 8"'$  (1.727 m)   Wt 200 lb (90.7 kg)   SpO2 96%   BMI 30.41 kg/m   Physical Exam Constitutional:      General: He is not in acute distress.    Appearance: Normal appearance. He is well-developed and overweight. He is not ill-appearing or toxic-appearing.  HENT:     Head: Normocephalic and atraumatic.  Cardiovascular:     Rate and Rhythm: Normal rate and regular rhythm.     Heart sounds: Normal heart sounds.  Pulmonary:     Effort: Pulmonary effort is normal.  Breath sounds: Normal breath sounds.     Comments: Clear to auscultation bilaterally  Abdominal:     General: Bowel sounds are normal.     Palpations: Abdomen is soft.     Tenderness: There is no abdominal tenderness.  Skin:    General: Skin is warm and dry.     Findings: Rash present. Rash is macular and papular.     Comments: Pink maculopapular rash on bilateral upper extremities and lower extremities. Excoriations noted.  No warmth, erythema, or discharge.  Neurological:     General: No focal deficit present.     Mental Status: He is alert.  Psychiatric:        Mood and Affect: Mood and affect normal.     Comments: Stutter     Results:  Labs: No results found for this or any previous visit (from the past 24 hour(s)).  Radiology: No results found.   UC Course/Treatments:  Procedures: Procedures   Medications Ordered in UC: Medications -  No data to display   Assessment and Plan :     ICD-10-CM   1. Allergic contact dermatitis due to plants, except food  L23.7      Allergic contact dermatitis due to plants, except food Afebrile, nontoxic-appearing, NAD. VSS. DDX includes but not limited to: contact dermatitis, eczema, irritant Medrol Pak and Zyrtec were prescribed for pruritus eruption. Patient refused steroid injection in office today. Strict ED precautions were given and patient verbalized understanding.   ED Discharge Orders          Ordered    methylPREDNISolone (MEDROL DOSEPAK) 4 MG TBPK tablet       Note to Pharmacy: [id number]   07/11/22 1626    cetirizine (ZYRTEC ALLERGY) 10 MG tablet  Daily PRN       Note to Pharmacy: [id number]   07/11/22 1630             PDMP not reviewed this encounter.     Carmie End, PA-C 07/11/22 1644

## 2022-07-11 NOTE — Discharge Instructions (Signed)
You have been given a short course of steroids to help with the rash and itching. Take as directed. I have also sent an antihistamine (Zyrtec) to help with the itching. Return in 2 to 3 days if no improvement. If new or worsening symptoms such as facial swelling, shortness of breath, or difficulty breathing, go directly to the ER.

## 2022-07-19 ENCOUNTER — Ambulatory Visit: Payer: Self-pay

## 2022-07-26 ENCOUNTER — Ambulatory Visit (INDEPENDENT_AMBULATORY_CARE_PROVIDER_SITE_OTHER): Payer: Medicare Other

## 2022-07-26 ENCOUNTER — Ambulatory Visit
Admission: RE | Admit: 2022-07-26 | Discharge: 2022-07-26 | Disposition: A | Discharge status: Home / Self Care | Payer: Medicare Other | Source: Ambulatory Visit | Attending: Family Medicine | Admitting: Family Medicine

## 2022-07-26 VITALS — BP 126/82 | HR 72 | Temp 98.6°F | Resp 17

## 2022-07-26 DIAGNOSIS — M542 Cervicalgia: Secondary | ICD-10-CM

## 2022-07-26 DIAGNOSIS — M5412 Radiculopathy, cervical region: Secondary | ICD-10-CM

## 2022-07-26 DIAGNOSIS — M62838 Other muscle spasm: Secondary | ICD-10-CM

## 2022-07-26 MED ORDER — METHOCARBAMOL 500 MG PO TABS: 500.0000 mg | ORAL_TABLET | Freq: Three times a day (TID) | ORAL | 0 refills | Status: DC | PRN

## 2022-07-26 MED ORDER — METHYLPREDNISOLONE SODIUM SUCC 125 MG IJ SOLR
125.0000 mg | Freq: Once | INTRAMUSCULAR | Status: AC
  Administered 2022-07-26: 125 mg via INTRAMUSCULAR

## 2022-07-26 MED ORDER — PREDNISONE 10 MG (21) PO TBPK: ORAL_TABLET | Freq: Every day | ORAL | 0 refills | Status: DC

## 2022-07-26 NOTE — ED Provider Notes (Signed)
[name]    CSN: [id number] Arrival date & time: 07/26/22  1635      History   Chief Complaint Chief Complaint  Patient presents with   Neck Pain    HPI Austin Gill is a 37 y.o. male.   HPI 37 year old male presents with possible pinched nerve affecting right arm/right wrist.  Patient reports neck pain radiating down right arm causing right finger and right thumb to feel numb for 2 weeks.  Reports neck pain worsens with movement.  Denies injury or trauma to posterior neck area.  PMH significant for acute midline low back pain without sciatica, obesity, and stutter.  Past Medical History:  Diagnosis Date   Anxiety    Cluster headaches    Depression    GERD (gastroesophageal reflux disease)    Stutter     Patient Active Problem List   Diagnosis Date Noted   Acute midline low back pain without sciatica 06/06/2017   Erectile dysfunction 06/06/2017   Left testicular pain 05/30/2016   Testicular nodule 05/30/2016   Overweight (BMI 25.0-29.9) 05/30/2016   Facial lesion 11/17/2014   Adjustment disorder with mixed anxiety and depressed mood 11/23/2013   Social anxiety disorder 11/23/2013   Dysthymia 11/23/2013   Essential tremor 09/28/2013   Fatty liver 12/07/2012   Sphenoidal sinus polyp 12/07/2012   Anxiety and depression 11/10/2012   GERD (gastroesophageal reflux disease) 11/10/2012   Stutter 11/10/2012    Past Surgical History:  Procedure Laterality Date   NO PAST SURGERIES         Home Medications    Prior to Admission medications   Medication Sig Start Date End Date Taking? Authorizing Provider  methocarbamol (ROBAXIN) 500 MG tablet Take 1 tablet (500 mg total) by mouth 3 (three) times daily as needed. 07/26/22  Yes Eliezer Lofts, FNP  predniSONE (STERAPRED UNI-PAK 21 TAB) 10 MG (21) TBPK tablet Take by mouth daily. Take 6 tabs by mouth daily  for 2 days, then 5 tabs for 2 days, then 4 tabs for 2 days, then 3 tabs for 2 days, 2 tabs for 2  days, then 1 tab by mouth daily for 2 days 07/26/22  Yes Eliezer Lofts, FNP  cetirizine (ZYRTEC ALLERGY) 10 MG tablet Take 1 tablet (10 mg total) by mouth daily as needed (itching). 07/11/22   Hermanns, Ashlee P, PA-C    Family History Family History  Problem Relation Age of Onset   Migraines Mother    Benign prostatic hyperplasia Father    Parkinson's disease Father    Prostate cancer Paternal Grandfather    Prostate cancer Paternal Uncle     Social History Social History   Tobacco Use   Smoking status: Never   Smokeless tobacco: Never  Vaping Use   Vaping Use: Some days   Devices: 3-4 times weekly  Substance Use Topics   Alcohol use: Yes    Alcohol/week: 2.0 standard drinks of alcohol    Types: 2 Standard drinks or equivalent per week    Comment: socially   Drug use: No     Allergies   Bactrim [sulfamethoxazole-trimethoprim], Buspar [buspirone], and Paxil [paroxetine hcl]   Review of Systems Review of Systems  Musculoskeletal:  Positive for neck pain.  All other systems reviewed and are negative.    Physical Exam Triage Vital Signs ED Triage Vitals  Enc Vitals Group     BP      Pulse      Resp  Temp      Temp src      SpO2      Weight      Height      Head Circumference      Peak Flow      Pain Score      Pain Loc      Pain Edu?      Excl. in [place]?    No data found.  Updated Vital Signs BP 126/82 (BP Location: Right Arm)   Pulse 72   Temp 98.6 F (37 C) (Oral)   Resp 17   SpO2 98%       Physical Exam Vitals and nursing note reviewed.  Constitutional:      General: He is not in acute distress.    Appearance: Normal appearance. He is normal weight. He is not ill-appearing.  HENT:     Head: Normocephalic and atraumatic.     Nose: Nose normal.     Mouth/Throat:     Mouth: Mucous membranes are moist.     Pharynx: Oropharynx is clear.  Eyes:     Extraocular Movements: Extraocular movements intact.     Conjunctiva/sclera: Conjunctivae  normal.     Pupils: Pupils are equal, round, and reactive to light.  Neck:     Comments: Limited range of motion with 4 planes of movement; moderately TTP over C6/C7 spinous processes, inferior splenis muscles, right trapezius/rhomboid muscle groups with palpable adhesions noted Cardiovascular:     Rate and Rhythm: Normal rate and regular rhythm.     Pulses: Normal pulses.     Heart sounds: Normal heart sounds.  Pulmonary:     Effort: Pulmonary effort is normal.     Breath sounds: Normal breath sounds. No wheezing, rhonchi or rales.  Musculoskeletal:        General: Normal range of motion.     Cervical back: Normal range of motion and neck supple.  Skin:    General: Skin is warm and dry.  Neurological:     General: No focal deficit present.     Mental Status: He is alert and oriented to person, place, and time. Mental status is at baseline.  Psychiatric:        Mood and Affect: Mood normal.        Behavior: Behavior normal.      UC Treatments / Results  Labs (all labs ordered are listed, but only abnormal results are displayed) Labs Reviewed - No data to display  EKG   Radiology DG Cervical Spine Complete  Result Date: 07/26/2022 CLINICAL DATA:  Neck pain for 3 weeks, right upper extremity radicular symptoms EXAM: CERVICAL SPINE - COMPLETE 4+ VIEW COMPARISON:  None Available. FINDINGS: No vertebral subluxation is observed. Loss of intervertebral disc height at the C6-7 level attributable to degenerative disc disease. No significant degree of prevertebral soft tissue swelling. No visible fracture or acute bony findings. IMPRESSION: 1. Degenerative disc disease at the C6-7 level. If pain persists despite conservative therapy, MRI may be warranted for further characterization. Electronically Signed   By: Van Clines M.D.   On: 07/26/2022 17:50    Procedures Procedures (including critical care time)  Medications Ordered in UC Medications  methylPREDNISolone sodium  succinate (SOLU-MEDROL) 125 mg/2 mL injection 125 mg (125 mg Intramuscular Given 07/26/22 1806)    Initial Impression / Assessment and Plan / UC Course  I have reviewed the triage vital signs and the nursing notes.  Pertinent labs & imaging results that were  available during my care of the patient were reviewed by me and considered in my medical decision making (see chart for details).     MDM: 1.  Neck pain-IM Solu-Medrol 125 mg given once in clinic prior to discharge, 2.  Cervical radiculopathy at C7- Rx'd Sterapred Unipak to start tomorrow morning Saturday, 07/27/2022 (tapering from 60 mg to 10 mg over 10 days).  3.  Trapezius muscle spasm-Rx'd Robaxin 500 mg twice daily, as needed x 10 days. Advised patient of C-spine x-ray results with hardcopy provided to patient.  Advised patient to take medication (tomorrow morning Saturday, 07/27/2022) as directed with food to completion.  Advised may use Robaxin daily or as needed for accompanying muscle spasms of trapezius and rhomboid muscle groups.  Encouraged patient to increase daily water intake to 64 ounces per day while taking these medications.  Advised if symptoms worsen and/or unresolved please follow-up with [facility] orthopedic provider for further evaluation.  Contact information is included with AVS today.  Patient discharged home, hemodynamically stable. Final Clinical Impressions(s) / UC Diagnoses   Final diagnoses:  Neck pain  Trapezius muscle spasm  Cervical radiculopathy at C7     Discharge Instructions      Advised patient of C-spine x-ray results with hardcopy provided to patient.  Advised patient to take medication (tomorrow morning Saturday, 07/27/2022) as directed with food to completion.  Advised may use Robaxin daily or as needed for accompanying muscle spasms of trapezius and rhomboid muscle groups.  Encouraged patient to increase daily water intake to 64 ounces per day while taking these medications.  Advised if symptoms  worsen and/or unresolved please follow-up with [facility] orthopedic provider for further evaluation.  Contact information is included with AVS today.     ED Prescriptions     Medication Sig Dispense Auth. Provider   predniSONE (STERAPRED UNI-PAK 21 TAB) 10 MG (21) TBPK tablet Take by mouth daily. Take 6 tabs by mouth daily  for 2 days, then 5 tabs for 2 days, then 4 tabs for 2 days, then 3 tabs for 2 days, 2 tabs for 2 days, then 1 tab by mouth daily for 2 days 42 tablet Eliezer Lofts, FNP   methocarbamol (ROBAXIN) 500 MG tablet Take 1 tablet (500 mg total) by mouth 3 (three) times daily as needed. 30 tablet Eliezer Lofts, FNP      PDMP not reviewed this encounter.   Eliezer Lofts, [place] 07/26/22 1816

## 2022-07-26 NOTE — Discharge Instructions (Addendum)
Advised patient of C-spine x-ray results with hardcopy provided to patient.  Advised patient to take medication (tomorrow morning Saturday, 07/27/2022) as directed with food to completion.  Advised may use Robaxin daily or as needed for accompanying muscle spasms of trapezius and rhomboid muscle groups.  Encouraged patient to increase daily water intake to 64 ounces per day while taking these medications.  Advised if symptoms worsen and/or unresolved please follow-up with [facility] orthopedic provider for further evaluation.  Contact information is included with AVS today.

## 2022-07-26 NOTE — ED Triage Notes (Signed)
Pt reports neck pain radiating down right arm causing the right finger and right thumb to feel numb x 2 weeks. States the neck pain worsens with movement. Denies injury or trauma. Has been taking Ibuprofen and Tylenol with no relief.

## 2022-09-11 ENCOUNTER — Ambulatory Visit
Admission: RE | Admit: 2022-09-11 | Discharge: 2022-09-11 | Disposition: A | Discharge status: Home / Self Care | Payer: Medicare Other | Source: Ambulatory Visit | Attending: Family Medicine | Admitting: Family Medicine

## 2022-09-11 VITALS — BP 128/81 | HR 77 | Temp 98.3°F | Resp 16

## 2022-09-11 DIAGNOSIS — J0100 Acute maxillary sinusitis, unspecified: Secondary | ICD-10-CM | POA: Diagnosis not present

## 2022-09-11 DIAGNOSIS — J309 Allergic rhinitis, unspecified: Secondary | ICD-10-CM | POA: Diagnosis not present

## 2022-09-11 DIAGNOSIS — R059 Cough, unspecified: Secondary | ICD-10-CM

## 2022-09-11 MED ORDER — AMOXICILLIN-POT CLAVULANATE 875-125 MG PO TABS: 1.0000 | ORAL_TABLET | Freq: Two times a day (BID) | ORAL | 0 refills | Status: AC

## 2022-09-11 MED ORDER — FEXOFENADINE HCL 180 MG PO TABS: 180.0000 mg | ORAL_TABLET | Freq: Every day | ORAL | 0 refills | Status: DC

## 2022-09-11 MED ORDER — BENZONATATE 200 MG PO CAPS: 200.0000 mg | ORAL_CAPSULE | Freq: Three times a day (TID) | ORAL | 0 refills | Status: AC | PRN

## 2022-09-11 NOTE — ED Provider Notes (Signed)
[facility]    CSN: [id number] Arrival date & time: 09/11/22  1459      History   Chief Complaint Chief Complaint  Patient presents with   Chills    Cough and chills and had a 100 fever last 4 days - Entered by patient    HPI Austin Gill is a 37 y.o. male.   HPI Pleasant 37 year old male presents with cough and chills for the past 4 days reports oral temperature of 100.04 days ago.  PMH significant for's daughter, depression, and essential tremor.  Past Medical History:  Diagnosis Date   Anxiety    Cluster headaches    Depression    GERD (gastroesophageal reflux disease)    Stutter     Patient Active Problem List   Diagnosis Date Noted   Acute midline low back pain without sciatica 06/06/2017   Erectile dysfunction 06/06/2017   Left testicular pain 05/30/2016   Testicular nodule 05/30/2016   Overweight (BMI 25.0-29.9) 05/30/2016   Facial lesion 11/17/2014   Adjustment disorder with mixed anxiety and depressed mood 11/23/2013   Social anxiety disorder 11/23/2013   Dysthymia 11/23/2013   Essential tremor 09/28/2013   Fatty liver 12/07/2012   Sphenoidal sinus polyp 12/07/2012   Anxiety and depression 11/10/2012   GERD (gastroesophageal reflux disease) 11/10/2012   Stutter 11/10/2012    Past Surgical History:  Procedure Laterality Date   NO PAST SURGERIES         Home Medications    Prior to Admission medications   Medication Sig Start Date End Date Taking? Authorizing Provider  amoxicillin-clavulanate (AUGMENTIN) 875-125 MG tablet Take 1 tablet by mouth 2 (two) times daily for 10 days. 09/11/22 09/21/22 Yes Trevor Iha, FNP  benzonatate (TESSALON) 200 MG capsule Take 1 capsule (200 mg total) by mouth 3 (three) times daily as needed for up to 7 days. 09/11/22 09/18/22 Yes Trevor Iha, FNP  fexofenadine [facility] ALLERGY) 180 MG tablet Take 1 tablet (180 mg total) by mouth daily for 15 days. 09/11/22 09/26/22 Yes Trevor Iha, FNP    Family  History Family History  Problem Relation Age of Onset   Migraines Mother    Benign prostatic hyperplasia Father    Parkinson's disease Father    Prostate cancer Paternal Grandfather    Prostate cancer Paternal Uncle     Social History Social History   Tobacco Use   Smoking status: Never   Smokeless tobacco: Never  Vaping Use   Vaping Use: Some days   Devices: 3-4 times weekly  Substance Use Topics   Alcohol use: Yes    Alcohol/week: 2.0 standard drinks of alcohol    Types: 2 Standard drinks or equivalent per week    Comment: socially   Drug use: No     Allergies   Bactrim [sulfamethoxazole-trimethoprim], Buspar [buspirone], and Paxil [paroxetine hcl]   Review of Systems Review of Systems   Physical Exam Triage Vital Signs ED Triage Vitals  Enc Vitals Group     BP 09/11/22 1510 128/81     Pulse Rate 09/11/22 1510 77     Resp 09/11/22 1510 16     Temp 09/11/22 1510 98.3 F (36.8 C)     Temp Source 09/11/22 1510 Oral     SpO2 09/11/22 1510 96 %     Weight --      Height --      Head Circumference --      Peak Flow --      Pain  Score 09/11/22 1513 0     Pain Loc --      Pain Edu? --      Excl. in [place]? --    No data found.  Updated Vital Signs BP 128/81 (BP Location: Right Arm)   Pulse 77   Temp 98.3 F (36.8 C) (Oral)   Resp 16   SpO2 96%    Physical Exam Vitals and nursing note reviewed.  Constitutional:      Appearance: Normal appearance. He is normal weight.  HENT:     Head: Normocephalic and atraumatic.     Right Ear: Tympanic membrane and external ear normal.     Left Ear: Tympanic membrane and external ear normal.     Ears:     Comments: Significant eustachian tube dysfunction noted bilaterally    Nose: Nose normal.     Mouth/Throat:     Mouth: Mucous membranes are moist.     Pharynx: Oropharynx is clear.     Comments: Significant amount of clear drainage of posterior oropharynx noted with concurrent cobblestoning Eyes:      Extraocular Movements: Extraocular movements intact.     Conjunctiva/sclera: Conjunctivae normal.     Pupils: Pupils are equal, round, and reactive to light.  Cardiovascular:     Rate and Rhythm: Normal rate and regular rhythm.     Pulses: Normal pulses.     Heart sounds: Normal heart sounds.  Pulmonary:     Effort: Pulmonary effort is normal.     Breath sounds: Normal breath sounds. No wheezing or rhonchi.     Comments: Infrequent nonproductive cough noted on exam Musculoskeletal:        General: Normal range of motion.     Cervical back: Normal range of motion and neck supple.  Skin:    General: Skin is warm and dry.  Neurological:     General: No focal deficit present.     Mental Status: He is alert and oriented to person, place, and time. Mental status is at baseline.      UC Treatments / Results  Labs (all labs ordered are listed, but only abnormal results are displayed) Labs Reviewed - No data to display  EKG   Radiology No results found.  Procedures Procedures (including critical care time)  Medications Ordered in UC Medications - No data to display  Initial Impression / Assessment and Plan / UC Course  I have reviewed the triage vital signs and the nursing notes.  Pertinent labs & imaging results that were available during my care of the patient were reviewed by me and considered in my medical decision making (see chart for details).     MDM: 1.  Acute maxillary sinusitis, recurrence not specified-Rx'd Augmentin 875/125 mg twice daily x 10 days.  2.  Cough-Rx'd Tessalon 200 mg 3 times daily, as needed for cough; 3.  Allergic rhinitis-Rx'd Allegra 180 mg daily x 5 days, then as needed. Advised patient to take medications as directed with food to completion.  Advised patient to take Allegra with first dose of Augmentin for the next 5 to 10 days.  Advised may use Allegra as needed afterwards for concurrent postnasal drainage/drip.  Advised may use Tessalon daily or  as needed for cough.  Encouraged increase daily water intake to 64 ounces per day while taking these medications.  Advised if symptoms worsen and/or unresolved please follow-up with PCP or here for further evaluation.   Final Clinical Impressions(s) / UC Diagnoses   Final diagnoses:  Acute maxillary sinusitis, recurrence not specified  Allergic rhinitis, unspecified seasonality, unspecified trigger  Cough, unspecified type     Discharge Instructions      Advised patient to take medications as directed with food to completion.  Advised patient to take Allegra with first dose of Augmentin for the next 5 to 10 days.  Advised may use Allegra as needed afterwards for concurrent postnasal drainage/drip.  Advised may use Tessalon daily or as needed for cough.  Encouraged increase daily water intake to 64 ounces per day while taking these medications.  Advised if symptoms worsen and/or unresolved please follow-up with PCP or here for further evaluation.     ED Prescriptions     Medication Sig Dispense Auth. Provider   amoxicillin-clavulanate (AUGMENTIN) 875-125 MG tablet Take 1 tablet by mouth 2 (two) times daily for 10 days. 20 tablet Trevor Iha, FNP   fexofenadine [facility] ALLERGY) 180 MG tablet Take 1 tablet (180 mg total) by mouth daily for 15 days. 15 tablet Trevor Iha, FNP   benzonatate (TESSALON) 200 MG capsule Take 1 capsule (200 mg total) by mouth 3 (three) times daily as needed for up to 7 days. 40 capsule Trevor Iha, FNP      PDMP not reviewed this encounter.   Trevor Iha, FNP 09/11/22 1546

## 2022-09-11 NOTE — ED Triage Notes (Signed)
Pt c/o feeling fatigue, chills and congestion since Sunday. States he had a fever last on Monday. Now having increase cough and chest congestion but afebrile. He is taking OTC cough and cold meds.

## 2022-09-11 NOTE — Discharge Instructions (Addendum)
Advised patient to take medications as directed with food to completion.  Advised patient to take Allegra with first dose of Augmentin for the next 5 to 10 days.  Advised may use Allegra as needed afterwards for concurrent postnasal drainage/drip.  Advised may use Tessalon daily or as needed for cough.  Encouraged increase daily water intake to 64 ounces per day while taking these medications.  Advised if symptoms worsen and/or unresolved please follow-up with PCP or here for further evaluation.

## 2022-09-30 ENCOUNTER — Ambulatory Visit
Admission: RE | Admit: 2022-09-30 | Discharge: 2022-09-30 | Disposition: A | Discharge status: Home / Self Care | Payer: Medicare Other | Source: Ambulatory Visit

## 2022-09-30 VITALS — BP 127/84 | HR 83 | Temp 99.2°F | Resp 16 | Wt 200.0 lb

## 2022-09-30 DIAGNOSIS — L0211 Cutaneous abscess of neck: Secondary | ICD-10-CM

## 2022-09-30 MED ORDER — CEPHALEXIN 500 MG PO CAPS: 1000.0000 mg | ORAL_CAPSULE | Freq: Two times a day (BID) | ORAL | 0 refills | Status: AC

## 2022-09-30 NOTE — ED Provider Notes (Signed)
[facility]    CSN: [id number] Arrival date & time: 09/30/22  1550      History   Chief Complaint Chief Complaint  Patient presents with   Abscess    HPI Austin Gill is a 37 y.o. male.   HPI 37 year old male presents with abscess to back of neck for 3 days.  Patient reports lump has been there for 5 months however was much smaller.  PMH significant for stutter, cluster headaches, and essential tremor  Past Medical History:  Diagnosis Date   Anxiety    Cluster headaches    Depression    GERD (gastroesophageal reflux disease)    Stutter     Patient Active Problem List   Diagnosis Date Noted   Acute midline low back pain without sciatica 06/06/2017   Erectile dysfunction 06/06/2017   Left testicular pain 05/30/2016   Testicular nodule 05/30/2016   Overweight (BMI 25.0-29.9) 05/30/2016   Facial lesion 11/17/2014   Adjustment disorder with mixed anxiety and depressed mood 11/23/2013   Social anxiety disorder 11/23/2013   Dysthymia 11/23/2013   Essential tremor 09/28/2013   Fatty liver 12/07/2012   Sphenoidal sinus polyp 12/07/2012   Anxiety and depression 11/10/2012   GERD (gastroesophageal reflux disease) 11/10/2012   Stutter 11/10/2012    Past Surgical History:  Procedure Laterality Date   NO PAST SURGERIES         Home Medications    Prior to Admission medications   Medication Sig Start Date End Date Taking? Authorizing Provider  cephALEXin (KEFLEX) 500 MG capsule Take 2 capsules (1,000 mg total) by mouth 2 (two) times daily for 7 days. 09/30/22 10/07/22 Yes Trevor Iha, FNP  omeprazole (PRILOSEC) 10 MG capsule Take by mouth. 07/01/16  Yes [provider]  fexofenadine (ALLEGRA ALLERGY) 180 MG tablet Take 1 tablet (180 mg total) by mouth daily for 15 days. Patient not taking: Reported on 09/30/2022 09/11/22 09/26/22  Trevor Iha, FNP    Family History Family History  Problem Relation Age of Onset   Migraines Mother    Benign  prostatic hyperplasia Father    Parkinson's disease Father    Prostate cancer Paternal Grandfather    Prostate cancer Paternal Uncle     Social History Social History   Tobacco Use   Smoking status: Never   Smokeless tobacco: Never  Vaping Use   Vaping Use: Former   Quit date: 05/06/2021   Devices: 3-4 times weekly  Substance Use Topics   Alcohol use: Yes    Alcohol/week: 2.0 standard drinks of alcohol    Types: 2 Standard drinks or equivalent per week    Comment: socially   Drug use: No     Allergies   Bactrim [sulfamethoxazole-trimethoprim], Buspar [buspirone], and Paxil [paroxetine hcl]   Review of Systems Review of Systems  Skin:        Abscess of neck x 5 months     Physical Exam Triage Vital Signs ED Triage Vitals  Enc Vitals Group     BP 09/30/22 1628 127/84     Pulse Rate 09/30/22 1628 83     Resp 09/30/22 1628 16     Temp 09/30/22 1628 99.2 F (37.3 C)     Temp Source 09/30/22 1628 Oral     SpO2 09/30/22 1628 98 %     Weight 09/30/22 1631 199 lb 15.3 oz (90.7 kg)     Height --      Head Circumference --  Peak Flow --      Pain Score --      Pain Loc --      Pain Edu? --      Excl. in GC? --    No data found.  Updated Vital Signs BP 127/84 (BP Location: Left Arm)   Pulse 83   Temp 99.2 F (37.3 C) (Oral)   Resp 16   Wt 199 lb 15.3 oz (90.7 kg)   SpO2 98%   BMI 30.40 kg/m   Visual Acuity Right Eye Distance:   Left Eye Distance:   Bilateral Distance:    Right Eye Near:   Left Eye Near:    Bilateral Near:     Physical Exam Vitals and nursing note reviewed.  Constitutional:      Appearance: Normal appearance. He is normal weight.  HENT:     Head: Normocephalic and atraumatic.     Mouth/Throat:     Mouth: Mucous membranes are moist.     Pharynx: Oropharynx is clear.  Eyes:     Extraocular Movements: Extraocular movements intact.     Conjunctiva/sclera: Conjunctivae normal.     Pupils: Pupils are equal, round, and reactive  to light.  Cardiovascular:     Rate and Rhythm: Normal rate and regular rhythm.     Pulses: Normal pulses.     Heart sounds: Normal heart sounds.  Pulmonary:     Effort: Pulmonary effort is normal.     Breath sounds: Normal breath sounds. No wheezing or rhonchi.  Musculoskeletal:        General: Normal range of motion.     Cervical back: Normal range of motion and neck supple.  Skin:    General: Skin is warm and dry.     Comments: Posterior neck (inferior aspect):~1.5 cm circular shaped erythematous abscess, TTP-please see I&D procedure note  Neurological:     General: No focal deficit present.     Mental Status: He is alert and oriented to person, place, and time. Mental status is at baseline.      UC Treatments / Results  Labs (all labs ordered are listed, but only abnormal results are displayed) Labs Reviewed - No data to display  EKG   Radiology No results found.  Procedures Incision and Drainage  Date/Time: 09/30/2022 5:31 PM  Performed by: Trevor Iha, FNP Authorized by: Trevor Iha, FNP   Consent:    Consent obtained:  Verbal   Consent given by:  Patient   Risks, benefits, and alternatives were discussed: yes   Universal protocol:    Patient identity confirmed:  Verbally with patient and arm band Location:    Type:  Abscess   Location:  Neck   Neck location: central posterior. Pre-procedure details:    Skin preparation:  Povidone-iodine Sedation:    Sedation type:  None Anesthesia:    Anesthesia method:  Local infiltration   Local anesthetic:  Lidocaine 1% w/o epi Procedure type:    Complexity:  Simple Procedure details:    Needle aspiration: no     Incision types:  Stab incision   Incision depth:  Dermal   Drainage:  Serosanguinous   Drainage amount:  Moderate   Wound treatment:  Wound left open Post-procedure details:    Procedure completion:  Tolerated well, no immediate complications  (including critical care time)  Medications  Ordered in UC Medications - No data to display  Initial Impression / Assessment and Plan / UC Course  I have reviewed the triage  vital signs and the nursing notes.  Pertinent labs & imaging results that were available during my care of the patient were reviewed by me and considered in my medical decision making (see chart for details).     MDM: 1.  Abscess of neck-Rx'd Keflex 1 g twice daily x 7 days. Advised patient to leave abscess area open to air and allow skin to heal by secondary intention/scab formation.  Encouraged patient to leave this area open without applying topical antibiotic emollients to affected area.  Advised patient to take medication as directed with food to completion.  Encouraged increase daily water intake to 64 ounces per day while taking this medication.  Advised if symptoms worsen and/or unresolved please follow-up with PCP or here for further evaluation.  Patient discharged home, hemodynamically stable. Final Clinical Impressions(s) / UC Diagnoses   Final diagnoses:  Abscess of neck     Discharge Instructions      Advised patient to leave abscess area open to air and allow skin to heal by secondary intention/scab formation.  Encouraged patient to leave this area open without applying topical antibiotic emollients to affected area.  Advised patient to take medication as directed with food to completion.  Encouraged increase daily water intake to 64 ounces per day while taking this medication.  Advised if symptoms worsen and/or unresolved please follow-up with PCP or here for further evaluation.     ED Prescriptions     Medication Sig Dispense Auth. Provider   cephALEXin (KEFLEX) 500 MG capsule Take 2 capsules (1,000 mg total) by mouth 2 (two) times daily for 7 days. 28 capsule Trevor Iha, FNP      PDMP not reviewed this encounter.   Trevor Iha, FNP 09/30/22 1738

## 2022-09-30 NOTE — Discharge Instructions (Addendum)
Advised patient to leave abscess area open to air and allow skin to heal by secondary intention/scab formation.  Encouraged patient to leave this area open without applying topical antibiotic emollients to affected area.  Advised patient to take medication as directed with food to completion.  Encouraged increase daily water intake to 64 ounces per day while taking this medication.  Advised if symptoms worsen and/or unresolved please follow-up with PCP or here for further evaluation.

## 2022-09-30 NOTE — ED Triage Notes (Addendum)
Abscess to back of neck has grown and become painful over the last 3 days Warm to touch w/ white head  Pt says he has had a lump there for 5 months but was much smaller OTC treatment - 1gm at 0500 Neosporin daily

## 2023-08-08 ENCOUNTER — Inpatient Hospital Stay: Admission: RE | Admit: 2023-08-08 | Payer: Self-pay | Source: Ambulatory Visit

## 2023-08-08 ENCOUNTER — Telehealth: Payer: Self-pay

## 2023-08-09 ENCOUNTER — Other Ambulatory Visit: Payer: Self-pay

## 2023-08-09 ENCOUNTER — Ambulatory Visit
Admission: RE | Admit: 2023-08-09 | Discharge: 2023-08-09 | Disposition: A | Discharge status: Home / Self Care | Payer: Self-pay | Source: Ambulatory Visit

## 2023-08-09 VITALS — BP 119/79 | HR 83 | Temp 98.7°F | Resp 17

## 2023-08-09 DIAGNOSIS — S8012XA Contusion of left lower leg, initial encounter: Secondary | ICD-10-CM

## 2023-08-09 NOTE — ED Provider Notes (Signed)
 [facility]    CSN: [id number] Arrival date & time: 08/09/23  1357      History   Chief Complaint Chief Complaint  Patient presents with   Animal Bite    Dog bite 4/3    HPI REICHEN HUTZLER is a 38 y.o. male.   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  PMH significant for cluster headaches and GERD.  Past Medical History:  Diagnosis Date   Anxiety    Cluster headaches    Depression    GERD (gastroesophageal reflux disease)    Stutter     Patient Active Problem List   Diagnosis Date Noted   Acute midline low back pain without sciatica 06/06/2017   Erectile dysfunction 06/06/2017   Left testicular pain 05/30/2016   Testicular nodule 05/30/2016   Overweight (BMI 25.0-29.9) 05/30/2016   Facial lesion 11/17/2014   Adjustment disorder with mixed anxiety and depressed mood 11/23/2013   Social anxiety disorder 11/23/2013   Dysthymia 11/23/2013   Essential tremor 09/28/2013   Fatty liver 12/07/2012   Sphenoidal sinus polyp 12/07/2012   Anxiety and depression 11/10/2012   GERD (gastroesophageal reflux disease) 11/10/2012   Stutter 11/10/2012    Past Surgical History:  Procedure Laterality Date   NO PAST SURGERIES         Home Medications    Prior to Admission medications   Medication Sig Start Date End Date Taking? Authorizing Provider  fexofenadine (ALLEGRA ALLERGY) 180 MG tablet Take 1 tablet (180 mg total) by mouth daily for 15 days. Patient not taking: Reported on 09/30/2022 09/11/22 09/26/22  Trevor Iha, FNP  omeprazole (PRILOSEC) 10 MG capsule Take by mouth. 07/01/16   [provider]    Family History Family History  Problem Relation Age of Onset   Migraines Mother    Benign prostatic hyperplasia Father    Parkinson's disease Father    Prostate cancer Paternal Grandfather    Prostate cancer Paternal Uncle      Social History Social History   Tobacco Use   Smoking status: Never   Smokeless tobacco: Never  Vaping Use   Vaping status: Former   Quit date: 05/06/2021   Devices: 3-4 times weekly  Substance Use Topics   Alcohol use: Yes    Alcohol/week: 2.0 standard drinks of alcohol    Types: 2 Standard drinks or equivalent per week    Comment: socially   Drug use: No     Allergies   Bactrim [sulfamethoxazole-trimethoprim], Buspar [buspirone], and Paxil [paroxetine hcl]   Review of Systems Review of Systems  Skin:  Positive for wound.       Dog bite of left lower leg that occurred on Thursday, 08/07/2023     Physical Exam Triage Vital Signs ED Triage Vitals  Encounter Vitals Group     BP      Systolic BP Percentile      Diastolic BP Percentile      Pulse      Resp      Temp      Temp src      SpO2      Weight      Height      Head Circumference      Peak Flow      Pain Score      Pain Loc      Pain Education  Exclude from Growth Chart    No data found.  Updated Vital Signs BP 119/79 (BP Location: Right Arm)   Pulse 83   Temp 98.7 F (37.1 C) (Oral)   Resp 17   SpO2 96%   Physical Exam Vitals and nursing note reviewed.  Constitutional:      Appearance: Normal appearance. He is normal weight.  HENT:     Head: Normocephalic and atraumatic.     Mouth/Throat:     Mouth: Mucous membranes are moist.     Pharynx: Oropharynx is clear.  Eyes:     Extraocular Movements: Extraocular movements intact.     Conjunctiva/sclera: Conjunctivae normal.     Pupils: Pupils are equal, round, and reactive to light.  Cardiovascular:     Rate and Rhythm: Normal rate and regular rhythm.     Pulses: Normal pulses.     Heart sounds: Normal heart sounds.  Pulmonary:     Effort: Pulmonary effort is normal.     Breath sounds: Normal breath sounds. No wheezing, rhonchi or rales.  Musculoskeletal:        General: Normal range of motion.     Cervical back: Normal range of  motion and neck supple.     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  Neurological:     General: No focal deficit present.     Mental Status: He is alert and oriented to person, place, and time. Mental status is at baseline.  Psychiatric:        Mood and Affect: Mood normal.        Behavior: Behavior normal.      UC Treatments / Results  Labs (all labs ordered are listed, but only abnormal results are displayed) Labs Reviewed - No data to display  EKG   Radiology No results found.  Procedures Procedures (including critical care time)  Medications Ordered in UC Medications - No data to display  Initial Impression / Assessment and Plan / UC Course  I have reviewed the triage vital signs and the nursing notes.  Pertinent labs & imaging results that were available during my care of the patient were reviewed by me and considered in my medical decision making (see chart for details).     MDM: 1.  Contusion of left lower leg, initial encounter-Advised patient to RICE contusion/bruise of left lower leg for 30 minutes 3 times daily for the next 3 days.  Advised if symptoms worsen and/or unresolved please follow-up with your PCP or here for further evaluation.  Patient discharged home, hemodynamically stable. Final Clinical Impressions(s) / UC Diagnoses   Final diagnoses:  Contusion of left lower leg, initial encounter     Discharge Instructions      Advised patient to RICE contusion/bruise of left lower leg for 30 minutes 3 times daily for the next 3 days.  Advised if symptoms worsen and/or unresolved please follow-up with your PCP or here for further evaluation.     ED Prescriptions   None    PDMP not reviewed this encounter.   Trevor Iha,  FNP 08/09/23 1437

## 2023-08-09 NOTE — ED Triage Notes (Signed)
 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

## 2023-08-09 NOTE — Discharge Instructions (Addendum)
 Advised patient to RICE contusion/bruise of left lower leg for 30 minutes 3 times daily for the next 3 days.  Advised if symptoms worsen and/or unresolved please follow-up with your PCP or here for further evaluation.

## 2023-08-13 ENCOUNTER — Ambulatory Visit: Admitting: Family Medicine

## 2023-08-14 ENCOUNTER — Ambulatory Visit

## 2023-08-15 ENCOUNTER — Ambulatory Visit

## 2023-09-11 ENCOUNTER — Ambulatory Visit

## 2023-09-11 ENCOUNTER — Ambulatory Visit: Payer: Self-pay

## 2023-09-12 ENCOUNTER — Ambulatory Visit
Admission: RE | Admit: 2023-09-12 | Discharge: 2023-09-12 | Disposition: A | Discharge status: Home / Self Care | Source: Ambulatory Visit | Attending: Family Medicine | Admitting: Family Medicine

## 2023-09-12 ENCOUNTER — Other Ambulatory Visit: Payer: Self-pay

## 2023-09-12 ENCOUNTER — Inpatient Hospital Stay: Admission: RE | Admit: 2023-09-12 | Source: Ambulatory Visit

## 2023-09-12 VITALS — BP 123/82 | HR 87 | Temp 99.0°F | Resp 16

## 2023-09-12 DIAGNOSIS — J209 Acute bronchitis, unspecified: Secondary | ICD-10-CM

## 2023-09-12 LAB — POCT URINALYSIS DIP (MANUAL ENTRY)
Bilirubin, UA: NEGATIVE
Blood, UA: NEGATIVE
Glucose, UA: NEGATIVE mg/dL
Ketones, POC UA: NEGATIVE mg/dL
Leukocytes, UA: NEGATIVE
Nitrite, UA: NEGATIVE
Spec Grav, UA: >=1.03 — AB (ref 1.010–1.025)
Urobilinogen, UA: 0.2 U/dL
pH, UA: 6.5 (ref 5.0–8.0)

## 2023-09-12 LAB — POCT RAPID STREP A (OFFICE): Rapid Strep A Screen: NEGATIVE

## 2023-09-12 MED ORDER — AMOXICILLIN 875 MG PO TABS: ORAL_TABLET | ORAL | 0 refills | Status: DC

## 2023-09-12 NOTE — ED Provider Notes (Signed)
 [facility]    CSN: [id number] Arrival date & time: 09/12/23  1104      History   Chief Complaint Chief Complaint  Patient presents with   Sore Throat    HPI Austin Gill is a 38 y.o. male.   HPI  Past Medical History:  Diagnosis Date   Anxiety    Cluster headaches    Depression    GERD (gastroesophageal reflux disease)    Stutter     Patient Active Problem List   Diagnosis Date Noted   Acute midline low back pain without sciatica 06/06/2017   Erectile dysfunction 06/06/2017   Left testicular pain 05/30/2016   Testicular nodule 05/30/2016   Overweight (BMI 25.0-29.9) 05/30/2016   Facial lesion 11/17/2014   Adjustment disorder with mixed anxiety and depressed mood 11/23/2013   Social anxiety disorder 11/23/2013   Dysthymia 11/23/2013   Essential tremor 09/28/2013   Fatty liver 12/07/2012   Sphenoidal sinus polyp 12/07/2012   Anxiety and depression 11/10/2012   GERD (gastroesophageal reflux disease) 11/10/2012   Stutter 11/10/2012    Past Surgical History:  Procedure Laterality Date   NO PAST SURGERIES         Home Medications    Prior to Admission medications   Medication Sig Start Date End Date Taking? Authorizing Provider  amoxicillin  (AMOXIL ) 875 MG tablet Take one tab PO Q12hr 09/12/23  Yes Sherry Rogus, Lenis Quin, MD  fexofenadine  (ALLEGRA  ALLERGY) 180 MG tablet Take 1 tablet (180 mg total) by mouth daily for 15 days. Patient not taking: Reported on 09/30/2022 09/11/22 09/26/22  Leonides Ramp, FNP  omeprazole (PRILOSEC) 10 MG capsule Take by mouth. 07/01/16   [provider]    Family History Family History  Problem Relation Age of Onset   Migraines Mother    Benign prostatic hyperplasia Father    Parkinson's disease Father    Prostate cancer Paternal Grandfather    Prostate cancer Paternal Uncle     Social History Social History   Tobacco Use   Smoking status: Never   Smokeless tobacco: Never  Vaping Use   Vaping status:  Former   Quit date: 05/06/2021   Devices: 3-4 times weekly  Substance Use Topics   Alcohol use: Yes    Alcohol/week: 2.0 standard drinks of alcohol    Types: 2 Standard drinks or equivalent per week    Comment: socially   Drug use: No     Allergies   Bactrim  [sulfamethoxazole -trimethoprim ], Buspar  [buspirone ], and Paxil [paroxetine hcl]   Review of Systems Review of Systems   Physical Exam Triage Vital Signs ED Triage Vitals  Encounter Vitals Group     BP 09/12/23 1111 123/82     Systolic BP Percentile --      Diastolic BP Percentile --      Pulse Rate 09/12/23 1111 87     Resp 09/12/23 1111 16     Temp 09/12/23 1111 99 F (37.2 C)     Temp src --      SpO2 09/12/23 1111 97 %     Weight --      Height --      Head Circumference --      Peak Flow --      Pain Score 09/12/23 1114 8     Pain Loc --      Pain Education --      Exclude from Growth Chart --    No data found.  Updated Vital Signs BP 123/82  Pulse 87   Temp 99 F (37.2 C)   Resp 16   SpO2 97%   Visual Acuity Right Eye Distance:   Left Eye Distance:   Bilateral Distance:    Right Eye Near:   Left Eye Near:    Bilateral Near:     Physical Exam   UC Treatments / Results  Labs (all labs ordered are listed, but only abnormal results are displayed) Labs Reviewed  POCT URINALYSIS DIP (MANUAL ENTRY) - Abnormal; Notable for the following components:      Result Value   Spec Grav, UA >=1.030 (*)    Protein Ur, POC trace (*)    All other components within normal limits  POCT RAPID STREP A (OFFICE)    EKG   Radiology No results found.  Procedures Procedures (including critical care time)  Medications Ordered in UC Medications - No data to display  Initial Impression / Assessment and Plan / UC Course  I have reviewed the triage vital signs and the nursing notes.  Pertinent labs & imaging results that were available during my care of the patient were reviewed by me and considered in  my medical decision making (see chart for details).    Begin amoxicillin  875 mg Q12hr for one week. Followup with Family Doctor if not improved in one week.   Final Clinical Impressions(s) / UC Diagnoses   Final diagnoses:  Acute bronchitis, unspecified organism     Discharge Instructions      Take plain guaifenesin  (1200mg  extended release tabs such as Mucinex ) twice daily, with plenty of water, for cough and congestion. Get adequate rest.   May use Afrin nasal spray (or generic oxymetazoline) each morning for about 5 days and then discontinue.  Also recommend using saline nasal spray several times daily and saline nasal irrigation (AYR is a common brand).  Use Flonase nasal spray each morning after using Afrin nasal spray and saline nasal irrigation. Try warm salt water gargles for sore throat.  Stop all antihistamines for now, and other non-prescription cough/cold preparations. May take Ibuprofen  or Tylenol  for sore throat and fever.   If symptoms become significantly worse during the night or over the weekend, proceed to the local emergency room.   ED Prescriptions     Medication Sig Dispense Auth. Provider   amoxicillin  (AMOXIL ) 875 MG tablet Take one tab PO Q12hr 14 tablet Leon Rajas, MD      PDMP not reviewed this encounter.

## 2023-09-12 NOTE — ED Triage Notes (Addendum)
 Sore throat since Monday, as well as fever up to 100. Also has runny nose, cold chills, feeling cold. Has had tylenol  and ibuprofen . Some pain with urination that started Monday too but reports has been taking a lot of ibuprofen  and this does this sometimes.

## 2023-09-12 NOTE — Discharge Instructions (Addendum)
 Take plain guaifenesin  (1200mg  extended release tabs such as Mucinex ) twice daily, with plenty of water, for cough and congestion. Get adequate rest.   May use Afrin nasal spray (or generic oxymetazoline) each morning for about 5 days and then discontinue.  Also recommend using saline nasal spray several times daily and saline nasal irrigation (AYR is a common brand).  Use Flonase nasal spray each morning after using Afrin nasal spray and saline nasal irrigation. Try warm salt water gargles for sore throat.  Stop all antihistamines for now, and other non-prescription cough/cold preparations. May take Ibuprofen  or Tylenol  for sore throat and fever.   If symptoms become significantly worse during the night or over the weekend, proceed to the local emergency room.

## 2023-11-18 ENCOUNTER — Ambulatory Visit (INDEPENDENT_AMBULATORY_CARE_PROVIDER_SITE_OTHER): Admitting: Family Medicine

## 2023-11-18 VITALS — BP 114/73 | HR 64 | Temp 98.0°F | Ht 67.0 in | Wt 178.0 lb

## 2023-11-18 DIAGNOSIS — Z7251 High risk heterosexual behavior: Secondary | ICD-10-CM | POA: Insufficient documentation

## 2023-11-18 DIAGNOSIS — Z7689 Persons encountering health services in other specified circumstances: Secondary | ICD-10-CM | POA: Diagnosis not present

## 2023-11-18 DIAGNOSIS — R079 Chest pain, unspecified: Secondary | ICD-10-CM | POA: Diagnosis not present

## 2023-11-18 DIAGNOSIS — Z1159 Encounter for screening for other viral diseases: Secondary | ICD-10-CM | POA: Insufficient documentation

## 2023-11-18 DIAGNOSIS — R0600 Dyspnea, unspecified: Secondary | ICD-10-CM | POA: Insufficient documentation

## 2023-11-18 DIAGNOSIS — Z23 Encounter for immunization: Secondary | ICD-10-CM | POA: Diagnosis not present

## 2023-11-18 NOTE — Assessment & Plan Note (Deleted)
 Unprotected intercourse. No symptoms or known exposures.

## 2023-11-18 NOTE — Progress Notes (Signed)
 New Patient Office Visit  Subjective    Patient ID: Austin Gill, male    DOB: [birth year]  Age: 38 y.o. MRN: [id number]  CC:  Chief Complaint  Patient presents with   Chest Pain    Shortness of breath    HPI Austin Gill presents to establish care with this practice. He is new to me.  Blurry vision with eye pain. Feels like his heart is being squeezed. Started 3 months ago, feels like a hand is squeezing his heart. Last about 10 minutes. Points to area above left nipple where he feels this squeezing.  Pain with exertion: goes on walks every morning without chest pain.  Nausea occasionally, not associated with pain in chest. Nausea present off and on for 2 years.  Shortness of breath: occurs when he is driving, feels like he can't catch his breath. Mucus in his throat. Come and goes. Does not smoke.  No fever or recent illnesses Sick in May, did not have strep throat. Treated with amoxicillin .   Endorses anxiety due to stuttering since age 73 years.  Interested in discussing anxiety at future visit.    Outpatient Encounter Medications as of 11/18/2023  Medication Sig   omeprazole (PRILOSEC) 10 MG capsule Take by mouth.   [DISCONTINUED] amoxicillin  (AMOXIL ) 875 MG tablet Take one tab PO Q12hr   [DISCONTINUED] fexofenadine  (ALLEGRA  ALLERGY) 180 MG tablet Take 1 tablet (180 mg total) by mouth daily for 15 days. (Patient not taking: Reported on 09/30/2022)   No facility-administered encounter medications on file as of 11/18/2023.    Past Medical History:  Diagnosis Date   Anxiety    Cluster headaches    Depression    GERD (gastroesophageal reflux disease)    Stutter     Past Surgical History:  Procedure Laterality Date   NO PAST SURGERIES      Family History  Problem Relation Age of Onset   Migraines Mother    Benign prostatic hyperplasia Father    Parkinson's disease Father    Prostate cancer Paternal Grandfather    Prostate cancer Paternal Uncle     Social  History   Socioeconomic History   Marital status: Single    Spouse name: Not on file   Number of children: Not on file   Years of education: Not on file   Highest education level: Associate degree: occupational, Scientist, product/process development, or vocational program  Occupational History   Not on file  Tobacco Use   Smoking status: Never   Smokeless tobacco: Never  Vaping Use   Vaping status: Former   Quit date: 05/06/2021   Devices: 3-4 times weekly  Substance and Sexual Activity   Alcohol use: Yes    Alcohol/week: 2.0 standard drinks of alcohol    Types: 2 Standard drinks or equivalent per week    Comment: socially   Drug use: No   Sexual activity: Not Currently  Other Topics Concern   Not on file  Social History Narrative   Not on file   Social Drivers of Health   Financial Resource Strain: High Risk (11/18/2023)   Overall Financial Resource Strain (CARDIA)    Difficulty of Paying Living Expenses: Hard  Food Insecurity: Food Insecurity Present (11/18/2023)   Hunger Vital Sign    Worried About Running Out of Food in the Last Year: Sometimes true    Ran Out of Food in the Last Year: Never true  Transportation Needs: No Transportation Needs (11/18/2023)   PRAPARE - Transportation  Lack of Transportation (Medical): No    Lack of Transportation (Non-Medical): No  Physical Activity: Insufficiently Active (11/18/2023)   Exercise Vital Sign    Days of Exercise per Week: 4 days    Minutes of Exercise per Session: 30 min  Stress: No Stress Concern Present (11/18/2023)   [employer] of Occupational Health - Occupational Stress Questionnaire    Feeling of Stress: Not at all  Social Connections: Socially Isolated (11/18/2023)   Social Connection and Isolation Panel    Frequency of Communication with Friends and Family: Three times a week    Frequency of Social Gatherings with Friends and Family: Once a week    Attends Religious Services: Never    Database administrator or Organizations: No     Attends Engineer, structural: Not on file    Marital Status: Never married  Intimate Partner Violence: Unknown (08/07/2021)   Received from [facility]   HITS    Physically Hurt: Not on file    Insult or Talk Down To: Not on file    Threaten Physical Harm: Not on file    Scream or Curse: Not on file    ROS      Objective    BP 114/73 (BP Location: Left Arm, Patient Position: Sitting, Cuff Size: Normal)   Pulse 64   Temp 98 F (36.7 C) (Oral)   Ht 5' 7 (1.702 m)   Wt 178 lb (80.7 kg)   SpO2 98%   BMI 27.88 kg/m   Physical Exam Vitals and nursing note reviewed.  Constitutional:      General: He is not in acute distress.    Appearance: He is well-developed.  Cardiovascular:     Rate and Rhythm: Normal rate and regular rhythm.     Heart sounds: Normal heart sounds.  Pulmonary:     Effort: Pulmonary effort is normal.     Breath sounds: Normal breath sounds.  Skin:    General: Skin is warm and dry.  Neurological:     General: No focal deficit present.     Mental Status: He is alert. Mental status is at baseline.  Psychiatric:        Mood and Affect: Mood normal.        Behavior: Behavior normal.        Thought Content: Thought content normal.        Judgment: Judgment normal.       11/18/2023   11:34 AM 05/30/2016    2:48 PM  Depression screen PHQ 2/9  Decreased Interest 2 0  Down, Depressed, Hopeless 2 0  PHQ - 2 Score 4 0  Altered sleeping 0   Tired, decreased energy 2   Change in appetite 1   Feeling bad or failure about yourself  2   Trouble concentrating 1   Moving slowly or fidgety/restless 1   Suicidal thoughts 0   PHQ-9 Score 11   Difficult doing work/chores Somewhat difficult       11/18/2023   11:35 AM  GAD 7 : Generalized Anxiety Score  Nervous, Anxious, on Edge 1  Control/stop worrying 1  Worry too much - different things 1  Trouble relaxing 1  Restless 1  Easily annoyed or irritable 0  Afraid - awful might happen 1  Total  GAD 7 Score 6  Anxiety Difficulty Somewhat difficult         Assessment & Plan:   Problem List Items Addressed This Visit  Encounter for administration of vaccine - Primary   Fell onto gravel.  Wounds have healed.  Last Tdap 2015. Update today.       Relevant Orders   Tdap vaccine greater than or equal to 7yo IM   Dyspnea   Describes as not being able to catch his breath. Lungs are clear today. No obvious shortness of breath during visit today. Stat chest x-ray.       Relevant Orders   EKG 12-Lead   CBC   Comprehensive metabolic panel with GFR   Hemoglobin A1c   Lipid panel   TSH + free T4   DG Chest 2 View   Chest pain   Describes as feeling like a hand is squeezing his heart. Last for 10 minutes.  Able to go on daily walks without chest pain.  EKG today: NSR: normal Points to specific place above left nipple.  Due to the ability to point to pain, referral to cardiology not indicated today.  Endorses anxiety. Labs today to rule out physical causes. Will continue to monitor.       Relevant Orders   EKG 12-Lead   CBC   Comprehensive metabolic panel with GFR   Hemoglobin A1c   Lipid panel   TSH + free T4   DG Chest 2 View   Unprotected sexual intercourse   Unprotected intercourse. No symptoms or known exposures.       Relevant Orders   Chlamydia/Gonococcus/Trichomonas, NAA   Hepatitis C antibody   HIV Antibody (routine testing w rflx)   RPR  Agrees with plan of care discussed.  Questions answered.   Return in about 1 month (around 12/22/2023) for to discuss anxiety .   Darice JONELLE Brownie, FNP

## 2023-11-18 NOTE — Assessment & Plan Note (Signed)
 Fell onto gravel.  Wounds have healed.  Last Tdap 2015. Update today.

## 2023-11-18 NOTE — Assessment & Plan Note (Addendum)
 Describes as feeling like a hand is squeezing his heart. Last for 10 minutes.  Able to go on daily walks without chest pain.  EKG today: NSR: normal Points to specific place above left nipple.  Due to the ability to point to pain, referral to cardiology not indicated today.  Endorses anxiety. Labs today to rule out physical causes. Will continue to monitor.

## 2023-11-18 NOTE — Patient Instructions (Signed)
 [facility]  [address]  The radiology department is on the first floor which is best accessed by going around to the back of the building. No appointment necessary. You can go at your convenience.

## 2023-11-18 NOTE — Assessment & Plan Note (Signed)
 Describes as not being able to catch his breath. Lungs are clear today. No obvious shortness of breath during visit today. Stat chest x-ray.

## 2023-11-18 NOTE — Assessment & Plan Note (Signed)
 Unprotected intercourse. No symptoms or known exposures.

## 2023-11-19 ENCOUNTER — Ambulatory Visit: Payer: Self-pay | Admitting: Family Medicine

## 2023-11-19 LAB — COMPREHENSIVE METABOLIC PANEL WITH GFR
ALT: 17 IU/L (ref 0–44)
AST: 20 IU/L (ref 0–40)
Albumin: 4.5 g/dL (ref 4.1–5.1)
Alkaline Phosphatase: 49 IU/L (ref 44–121)
BUN/Creatinine Ratio: 11 (ref 9–20)
BUN: 11 mg/dL (ref 6–20)
Bilirubin Total: 0.3 mg/dL (ref 0.0–1.2)
CO2: 20 mmol/L (ref 20–29)
Calcium: 9.6 mg/dL (ref 8.7–10.2)
Chloride: 102 mmol/L (ref 96–106)
Creatinine, Ser: 0.97 mg/dL (ref 0.76–1.27)
Globulin, Total: 2.8 g/dL (ref 1.5–4.5)
Glucose: 92 mg/dL (ref 70–99)
Potassium: 4.7 mmol/L (ref 3.5–5.2)
Sodium: 138 mmol/L (ref 134–144)
Total Protein: 7.3 g/dL (ref 6.0–8.5)
eGFR: 102 mL/min/1.73 (ref >59)

## 2023-11-19 LAB — TSH+FREE T4
Free T4: 1.04 ng/dL (ref 0.82–1.77)
TSH: 1.04 u[IU]/mL (ref 0.450–4.500)

## 2023-11-19 LAB — LIPID PANEL
Chol/HDL Ratio: 3 ratio (ref 0.0–5.0)
Cholesterol, Total: 130 mg/dL (ref 100–199)
HDL: 44 mg/dL (ref >39)
LDL Chol Calc (NIH): 68 mg/dL (ref 0–99)
Triglycerides: 93 mg/dL (ref 0–149)
VLDL Cholesterol Cal: 18 mg/dL (ref 5–40)

## 2023-11-19 LAB — HEMOGLOBIN A1C
Est. average glucose Bld gHb Est-mCnc: 100 mg/dL
Hgb A1c MFr Bld: 5.1 % (ref 4.8–5.6)

## 2023-11-19 LAB — HEPATITIS C ANTIBODY: Hep C Virus Ab: NONREACTIVE

## 2023-11-19 LAB — CBC
Hematocrit: 45 % (ref 37.5–51.0)
Hemoglobin: 15 g/dL (ref 13.0–17.7)
MCH: 31.8 pg (ref 26.6–33.0)
MCHC: 33.3 g/dL (ref 31.5–35.7)
MCV: 96 fL (ref 79–97)
Platelets: 308 x10E3/uL (ref 150–450)
RBC: 4.71 x10E6/uL (ref 4.14–5.80)
RDW: 13.1 % (ref 11.6–15.4)
WBC: 5.4 x10E3/uL (ref 3.4–10.8)

## 2023-11-19 LAB — RPR: RPR Ser Ql: NONREACTIVE

## 2023-11-19 LAB — HIV ANTIBODY (ROUTINE TESTING W REFLEX): HIV Screen 4th Generation wRfx: NONREACTIVE

## 2023-11-20 LAB — CHLAMYDIA/GONOCOCCUS/TRICHOMONAS, NAA
Chlamydia by NAA: NEGATIVE
Gonococcus by NAA: NEGATIVE
Trich vag by NAA: NEGATIVE

## 2023-12-23 ENCOUNTER — Ambulatory Visit: Admitting: Family Medicine

## 2024-02-09 ENCOUNTER — Ambulatory Visit (INDEPENDENT_AMBULATORY_CARE_PROVIDER_SITE_OTHER): Admitting: Family Medicine

## 2024-02-09 ENCOUNTER — Encounter: Payer: Self-pay | Admitting: Family Medicine

## 2024-02-09 ENCOUNTER — Ambulatory Visit: Payer: Self-pay

## 2024-02-09 VITALS — BP 114/70 | HR 74 | Ht 67.0 in | Wt 179.0 lb

## 2024-02-09 DIAGNOSIS — R10A3 Flank pain, bilateral: Secondary | ICD-10-CM | POA: Insufficient documentation

## 2024-02-09 DIAGNOSIS — R300 Dysuria: Secondary | ICD-10-CM | POA: Diagnosis not present

## 2024-02-09 DIAGNOSIS — R10A0 Flank pain, unspecified side: Secondary | ICD-10-CM | POA: Diagnosis not present

## 2024-02-09 LAB — POCT URINALYSIS DIP (CLINITEK)
Bilirubin, UA: NEGATIVE
Blood, UA: NEGATIVE
Glucose, UA: NEGATIVE mg/dL
Ketones, POC UA: NEGATIVE mg/dL
Leukocytes, UA: NEGATIVE
Nitrite, UA: NEGATIVE
POC PROTEIN,UA: NEGATIVE
Spec Grav, UA: 1.02 (ref 1.010–1.025)
Urobilinogen, UA: 1 U/dL
pH, UA: 6 (ref 5.0–8.0)

## 2024-02-09 NOTE — Assessment & Plan Note (Signed)
 Urinalysis without leukocytes or blood.  This does seem to be more musculoskeletal in nature.

## 2024-02-09 NOTE — Progress Notes (Signed)
 Austin Gill - 38 y.o. male MRN [id number]  Date of birth: 08/19/1985  Subjective Chief Complaint  Patient presents with   Flank Pain   Urinary Tract Infection    HPI Austin Gill is a 37 y.o. male here today with complaint of bilateral flank pain along with dysuria.  Symptoms started about 2 weeks ago as just flank pain.  Urinary symptoms started a few days ago.  His flank pain does improve and worsen with certain movements.  He has not had any frequency or urgency associated with his dysuria.  He has not noted any blood in his urine.  He does have some occasional nausea.  He does report having unprotected sex about a week ago.  Denies penile discharge or testicular pain.  .ROS:  A comprehensive ROS was completed and negative except as noted per HPI  Allergies  Allergen Reactions   Bactrim  [Sulfamethoxazole -Trimethoprim ] Other (See Comments)    Chest pain   Buspar  [Buspirone ]     Nausea   Paxil [Paroxetine Hcl]     Suicidal thoughts    Past Medical History:  Diagnosis Date   Anxiety    Cluster headaches    Depression    GERD (gastroesophageal reflux disease)    Stutter     Past Surgical History:  Procedure Laterality Date   NO PAST SURGERIES      Social History   Socioeconomic History   Marital status: Single    Spouse name: Not on file   Number of children: Not on file   Years of education: Not on file   Highest education level: Associate degree: occupational, Scientist, product/process development, or vocational program  Occupational History   Not on file  Tobacco Use   Smoking status: Never   Smokeless tobacco: Never  Vaping Use   Vaping status: Former   Quit date: 05/06/2021   Devices: 3-4 times weekly  Substance and Sexual Activity   Alcohol use: Yes    Alcohol/week: 2.0 standard drinks of alcohol    Types: 2 Standard drinks or equivalent per week    Comment: socially   Drug use: No   Sexual activity: Not Currently  Other Topics Concern   Not on file  Social History Narrative    Not on file   Social Drivers of Health   Financial Resource Strain: High Risk (11/18/2023)   Overall Financial Resource Strain (CARDIA)    Difficulty of Paying Living Expenses: Hard  Food Insecurity: Food Insecurity Present (11/18/2023)   Hunger Vital Sign    Worried About Running Out of Food in the Last Year: Sometimes true    Ran Out of Food in the Last Year: Never true  Transportation Needs: No Transportation Needs (11/18/2023)   PRAPARE - Administrator, Civil Service (Medical): No    Lack of Transportation (Non-Medical): No  Physical Activity: Insufficiently Active (11/18/2023)   Exercise Vital Sign    Days of Exercise per Week: 4 days    Minutes of Exercise per Session: 30 min  Stress: No Stress Concern Present (11/18/2023)   [employer] of Occupational Health - Occupational Stress Questionnaire    Feeling of Stress: Not at all  Social Connections: Socially Isolated (11/18/2023)   Social Connection and Isolation Panel    Frequency of Communication with Friends and Family: Three times a week    Frequency of Social Gatherings with Friends and Family: Once a week    Attends Religious Services: Never    Active Member of [employer]  or Organizations: No    Attends Engineer, structural: Not on file    Marital Status: Never married    Family History  Problem Relation Age of Onset   Migraines Mother    Benign prostatic hyperplasia Father    Parkinson's disease Father    Prostate cancer Paternal Grandfather    Prostate cancer Paternal Uncle     Health Maintenance  Topic Date Due   Medicare Annual Wellness (AWV)  Never done   Hepatitis B Vaccines 19-59 Average Risk (1 of 3 - 19+ 3-dose series) Never done   HPV VACCINES (1 - 3-dose SCDM series) Never done   Influenza Vaccine  Never done   COVID-19 Vaccine (3 - 2025-26 season) 01/05/2024   DTaP/Tdap/Td (3 - Td or Tdap) 11/17/2033   Hepatitis C Screening  Completed   HIV Screening  Completed   Pneumococcal  Vaccine  Aged Out   Meningococcal B Vaccine  Aged Out     ----------------------------------------------------------------------------------------------------------------------------------------------------------------------------------------------------------------- Physical Exam BP 114/70 (BP Location: Left Arm, Patient Position: Sitting, Cuff Size: Normal)   Pulse 74   Ht 5' 7 (1.702 m)   Wt 179 lb (81.2 kg)   SpO2 98%   BMI 28.04 kg/m   Physical Exam Constitutional:      Appearance: Normal appearance.  HENT:     Head: Normocephalic and atraumatic.  Eyes:     General: No scleral icterus. Cardiovascular:     Rate and Rhythm: Normal rate and regular rhythm.  Pulmonary:     Effort: Pulmonary effort is normal.     Breath sounds: Normal breath sounds.  Musculoskeletal:     Cervical back: Neck supple.     Comments: Tenderness along mid thoracic paraspinals bilaterally.  Mild CVA tenderness to percussion bilaterally.  Neurological:     General: No focal deficit present.     Mental Status: He is alert.  Psychiatric:        Mood and Affect: Mood normal.        Behavior: Behavior normal.     ------------------------------------------------------------------------------------------------------------------------------------------------------------------------------------------------------------------- Assessment and Plan  Burning with urination Urinalysis is unremarkable.  Checking STI panel.  Recent unprotected intercourse.  Also check PSA levels as he is having some flank and lower back pain as well.  Bilateral flank pain Urinalysis without leukocytes or blood.  This does seem to be more musculoskeletal in nature.   No orders of the defined types were placed in this encounter.   No follow-ups on file.

## 2024-02-09 NOTE — Assessment & Plan Note (Signed)
 Urinalysis is unremarkable.  Checking STI panel.  Recent unprotected intercourse.  Also check PSA levels as he is having some flank and lower back pain as well.

## 2024-02-09 NOTE — Telephone Encounter (Signed)
 FYI Only or Action Required?: FYI only for provider.  Patient was last seen in primary care on 11/18/2023 by Booker Darice SAUNDERS, FNP.  Called Nurse Triage reporting Flank Pain.  Symptoms began several weeks ago.  Symptoms are: gradually worsening.  Triage Disposition: See HCP Within 4 Hours (Or PCP Triage)  Patient/caregiver understands and will follow disposition?: Yes     Copied from CRM [id number]. Topic: Clinical - Red Word Triage >> Feb 09, 2024  1:13 PM Roselie BROCKS wrote: [facility] that prompted transfer to Nurse Triage: Patient states she is having really bad pain in his kidneys, and burns when urinates      Reason for Disposition  Pain or burning with passing urine (urination)  Answer Assessment - Initial Assessment Questions 1. LOCATION: Where does it hurt? (e.g., left, right)     Bilateral flank pain  2. ONSET: When did the pain start?     A few weeks 3. SEVERITY: How bad is the pain? (e.g., Scale 1-10; mild, moderate, or severe)     3/10 to 7/10 4. PATTERN: Does the pain come and go, or is it constant?      Constant  5. CAUSE: What do you think is causing the pain?     Unsure  6. OTHER SYMPTOMS:  Do you have any other symptoms? (e.g., fever, abdomen pain, vomiting, leg weakness, burning with urination, blood in urine)     Burning with urination  Protocols used: Flank Pain-A-AH

## 2024-02-10 ENCOUNTER — Telehealth: Payer: Self-pay

## 2024-02-10 ENCOUNTER — Ambulatory Visit: Payer: Self-pay | Admitting: Family Medicine

## 2024-02-10 DIAGNOSIS — R300 Dysuria: Secondary | ICD-10-CM | POA: Diagnosis not present

## 2024-02-10 DIAGNOSIS — R10A0 Flank pain, unspecified side: Secondary | ICD-10-CM | POA: Diagnosis not present

## 2024-02-10 LAB — CBC WITH DIFFERENTIAL/PLATELET
Basophils Absolute: 0 x10E3/uL (ref 0.0–0.2)
Basos: 1 %
EOS (ABSOLUTE): 0.3 x10E3/uL (ref 0.0–0.4)
Eos: 4 %
Hematocrit: 42.9 % (ref 37.5–51.0)
Hemoglobin: 14.4 g/dL (ref 13.0–17.7)
Immature Grans (Abs): 0 x10E3/uL (ref 0.0–0.1)
Immature Granulocytes: 0 %
Lymphocytes Absolute: 2 x10E3/uL (ref 0.7–3.1)
Lymphs: 30 %
MCH: 31.6 pg (ref 26.6–33.0)
MCHC: 33.6 g/dL (ref 31.5–35.7)
MCV: 94 fL (ref 79–97)
Monocytes Absolute: 0.6 x10E3/uL (ref 0.1–0.9)
Monocytes: 9 %
Neutrophils Absolute: 3.7 x10E3/uL (ref 1.4–7.0)
Neutrophils: 56 %
Platelets: 285 x10E3/uL (ref 150–450)
RBC: 4.55 x10E6/uL (ref 4.14–5.80)
RDW: 12.4 % (ref 11.6–15.4)
WBC: 6.5 x10E3/uL (ref 3.4–10.8)

## 2024-02-10 LAB — CMP14+EGFR
ALT: 16 IU/L (ref 0–44)
AST: 16 IU/L (ref 0–40)
Albumin: 4.2 g/dL (ref 4.1–5.1)
Alkaline Phosphatase: 44 IU/L — ABNORMAL LOW (ref 47–123)
BUN/Creatinine Ratio: 12 (ref 9–20)
BUN: 14 mg/dL (ref 6–24)
Bilirubin Total: 0.3 mg/dL (ref 0.0–1.2)
CO2: 22 mmol/L (ref 20–29)
Calcium: 9.3 mg/dL (ref 8.7–10.2)
Chloride: 100 mmol/L (ref 96–106)
Creatinine, Ser: 1.2 mg/dL (ref 0.76–1.27)
Globulin, Total: 2.7 g/dL (ref 1.5–4.5)
Glucose: 92 mg/dL (ref 70–99)
Potassium: 4.5 mmol/L (ref 3.5–5.2)
Sodium: 138 mmol/L (ref 134–144)
Total Protein: 6.9 g/dL (ref 6.0–8.5)
eGFR: 78 mL/min/1.73 (ref >59)

## 2024-02-10 LAB — RPR: RPR Ser Ql: NONREACTIVE

## 2024-02-10 LAB — HIV ANTIBODY (ROUTINE TESTING W REFLEX): HIV Screen 4th Generation wRfx: NONREACTIVE

## 2024-02-10 LAB — PSA: Prostate Specific Ag, Serum: 0.5 ng/mL (ref 0.0–4.0)

## 2024-02-10 NOTE — Telephone Encounter (Signed)
 Spoke with patient and informed him that provider has not reviewed labs as of yet . Advised him that he may see a message in his my chart or receive a phone call explaining result.  Patient gave a verbal understanding.    Copied from CRM #[id number]. Topic: Clinical - Lab/Test Results >> Feb 10, 2024  2:35 PM Amy B wrote: Reason for CRM: Patient requests a call back to discuss his lab results.

## 2024-02-11 ENCOUNTER — Ambulatory Visit: Payer: Self-pay

## 2024-02-11 ENCOUNTER — Ambulatory Visit

## 2024-02-11 ENCOUNTER — Inpatient Hospital Stay: Admission: RE | Admit: 2024-02-11 | Source: Home / Self Care

## 2024-02-11 NOTE — Telephone Encounter (Signed)
  FYI Only or Action Required?: FYI only for provider.  Patient was last seen in primary care on 02/09/2024 by Alvia Bring, DO.  Called Nurse Triage reporting Urinary Frequency.  Symptoms began na .  Interventions attempted: Other: at UC now .  Symptoms are: na .  Triage Disposition: No Contact Calls  Patient/caregiver understands and will follow disposition?: No, wishes to speak with PCP   Patient chart shows patient currently at [facility].      Reason for Disposition  Patient already left for the hospital/clinic.  Answer Assessment - Initial Assessment Questions Patient initial call to report burning with urination. 2 attempts made to contact patient and now in review of chart shows patient currently at [facility].  Protocols used: No Contact or Duplicate Contact Call-A-AH

## 2024-02-11 NOTE — Telephone Encounter (Signed)
2nd attempt left a voicemail

## 2024-02-11 NOTE — Telephone Encounter (Signed)
 1st attempt---left a voicemail for a call back

## 2024-02-11 NOTE — Telephone Encounter (Signed)
 Patient disconnected during transfer to triage; will attempt contact at a later time.           Copied from CRM [id number]. Topic: Clinical - Red Word Triage >> Feb 11, 2024  9:17 AM Larissa RAMAN wrote: [facility] that prompted transfer to Nurse Triage: burning with urination-worsening

## 2024-02-11 NOTE — Telephone Encounter (Signed)
 FYI Only or Action Required?: FYI only for provider. Refuses ED/UC due to cost- FU in AM  Patient was last seen in primary care on 02/09/2024 by Alvia Bring, DO.  Called Nurse Triage reporting Dysuria.  Symptoms began several days ago.  Interventions attempted: Rest, hydration, or home remedies.  Symptoms are: gradually worsening.  Triage Disposition: See HCP Within 4 Hours (Or PCP Triage)  Patient/caregiver understands and will follow disposition?: Yes  Copied from CRM #[id number]. Topic: Clinical - Red Word Triage >> Feb 11, 2024  5:35 PM Dedra B wrote: [facility] that prompted transfer to Nurse Triage: Pt is having pain with urination and groin pain. He spoke with NT this morning and feels that his symptoms and pain levels have worsened. Warm transfer to NT. Reason for Disposition  Side (flank) or lower back pain present  Answer Assessment - Initial Assessment Questions Pt seen for symptoms 10/6. Went to UC today for worsening, and unable to pay- they told him to go to ED and his ED co-pay was higher. Advised they cannot turn you away if you go in.   Patient is awaiting the results of his Bloodwork and Urine studies for STD/UTI testing. Urination feels like knives. States it feels the same as when he tested positive for gonorrhea. Since patient is unwilling to go into ED/UC due to cost, OV scheduled for 10/9 with PA in office to review labs and potentially order treatment plan. No history of kidney stones  ED/UC precautions given again with understanding and he will go tonight if it becomes worse or starts experiencing any CP, SOB, fever, or hematuria.   1. SYMPTOM: What's the main symptom you're concerned about? (e.g., frequency, incontinence)     Feels like knives when he voids 2. ONSET: When did the  dysuria  start?     4-5 days 3. PAIN: Is there any pain? If Yes, ask: How bad is it? (Scale: 1-10; mild, moderate, severe)     9/10 when trying to void, at rest 3-4/10  4.  CAUSE: What do you think is causing the symptoms?     STD exposure versus UTI 5. OTHER SYMPTOMS: Do you have any other symptoms? (e.g., blood in urine, fever, flank pain, pain with urination)     Flank pain but that has been for a couple weeks.  Protocols used: Urinary Symptoms-A-AH

## 2024-02-12 ENCOUNTER — Encounter: Payer: Self-pay | Admitting: Urgent Care

## 2024-02-12 ENCOUNTER — Ambulatory Visit (INDEPENDENT_AMBULATORY_CARE_PROVIDER_SITE_OTHER): Admitting: Urgent Care

## 2024-02-12 VITALS — BP 108/67 | HR 72 | Ht 67.0 in | Wt 178.0 lb

## 2024-02-12 DIAGNOSIS — R590 Localized enlarged lymph nodes: Secondary | ICD-10-CM | POA: Diagnosis not present

## 2024-02-12 DIAGNOSIS — N342 Other urethritis: Secondary | ICD-10-CM

## 2024-02-12 LAB — CHLAMYDIA/GONOCOCCUS/TRICHOMONAS, NAA
Chlamydia by NAA: NEGATIVE
Gonococcus by NAA: NEGATIVE
Trich vag by NAA: NEGATIVE

## 2024-02-12 MED ORDER — CEFTRIAXONE SODIUM 1 G IJ SOLR
1.0000 g | Freq: Once | INTRAMUSCULAR | Status: AC
  Administered 2024-02-12: 1 g via INTRAMUSCULAR

## 2024-02-12 MED ORDER — ONDANSETRON 8 MG PO TBDP: 8.0000 mg | ORAL_TABLET | Freq: Three times a day (TID) | ORAL | 0 refills | Status: DC | PRN

## 2024-02-12 MED ORDER — DOXYCYCLINE HYCLATE 100 MG PO TABS: 100.0000 mg | ORAL_TABLET | Freq: Two times a day (BID) | ORAL | 0 refills | Status: DC

## 2024-02-12 NOTE — Progress Notes (Signed)
 Austin Gill

## 2024-02-12 NOTE — Patient Instructions (Signed)
 Clinically, your symptoms are consistent with urethritis which is usually due to an STD. AVOID ALL FORMS OF INTERCOURSE for the next 10 days.  Please take the doxycycline  twice daily for the next 7 days. Avoid excessive sun exposure on the antibiotic.  Do not take within an hour before or two hours after eating it will not be absorbed. Avoid taking multivitamins while on the antibiotic.  To prevent nausea and vomiting on this medication, I have also called on sublingual zofran  (this dissolves under your tongue). Take as needed for antibiotic induced nausea.

## 2024-02-12 NOTE — Progress Notes (Signed)
 Established Patient Office Visit  Subjective:  Patient ID: Austin Gill, male    DOB: 1986-01-20  Age: 38 y.o. MRN: [id number]  Chief Complaint  Patient presents with   Exposure to STD    Pt presents due to complaints of dysuria, penile discharge, and inguinal lymphadenopathy/ pain. States he had sex with a new male partner at a bar this past Friday. Was seen in our office on Monday and had complete workup completed including swab and blood. Pt states he started having dysuria and discharge about three days after exposure. Denies any scrotal swelling. No systemic symptoms.    Patient Active Problem List   Diagnosis Date Noted   Burning with urination 02/09/2024   Bilateral flank pain 02/09/2024   Encounter for administration of vaccine 11/18/2023   Dyspnea 11/18/2023   Chest pain 11/18/2023   Screening for viral disease 11/18/2023   Unprotected sexual intercourse 11/18/2023   Acute midline low back pain without sciatica 06/06/2017   Left testicular pain 05/30/2016   Testicular nodule 05/30/2016   Adjustment disorder with mixed anxiety and depressed mood 11/23/2013   Social anxiety disorder 11/23/2013   Sphenoidal sinus polyp 12/07/2012   GERD (gastroesophageal reflux disease) 11/10/2012   Past Medical History:  Diagnosis Date   Anxiety    Cluster headaches    Depression    GERD (gastroesophageal reflux disease)    Stutter    Past Surgical History:  Procedure Laterality Date   NO PAST SURGERIES     Social History   Tobacco Use   Smoking status: Never   Smokeless tobacco: Never  Vaping Use   Vaping status: Former   Quit date: 05/06/2021   Devices: 3-4 times weekly  Substance Use Topics   Alcohol use: Yes    Alcohol/week: 2.0 standard drinks of alcohol    Types: 2 Standard drinks or equivalent per week    Comment: socially   Drug use: No      ROS: as noted in HPI  Objective:     BP 108/67   Pulse 72   Ht 5' 7 (1.702 m)   Wt 178 lb (80.7 kg)   SpO2  98%   BMI 27.88 kg/m  BP Readings from Last 3 Encounters:  02/12/24 108/67  02/09/24 114/70  11/18/23 114/73   Wt Readings from Last 3 Encounters:  02/12/24 178 lb (80.7 kg)  02/09/24 179 lb (81.2 kg)  11/18/23 178 lb (80.7 kg)      Physical Exam Vitals and nursing note reviewed. Exam conducted with a chaperone present.  Constitutional:      General: He is not in acute distress.    Appearance: Normal appearance. He is not ill-appearing or toxic-appearing.  Cardiovascular:     Rate and Rhythm: Normal rate.  Pulmonary:     Effort: Pulmonary effort is normal. No respiratory distress.  Abdominal:     Hernia: There is no hernia in the left inguinal area or right inguinal area.  Genitourinary:    Penis: Normal and circumcised.      Testes: Normal.        Right: Mass, tenderness, swelling, testicular hydrocele or varicocele not present.        Left: Mass, tenderness, swelling, testicular hydrocele or varicocele not present.     Epididymis:     Right: Normal. Not inflamed or enlarged. No mass or tenderness.     Left: Normal. Not inflamed or enlarged. No mass or tenderness.  Lymphadenopathy:     Lower  Body: Right inguinal adenopathy present. Left inguinal adenopathy present.  Neurological:     Mental Status: He is alert.      No results found for any visits on 02/12/24.  Last CBC Lab Results  Component Value Date   WBC 6.5 02/09/2024   HGB 14.4 02/09/2024   HCT 42.9 02/09/2024   MCV 94 02/09/2024   MCH 31.6 02/09/2024   RDW 12.4 02/09/2024   PLT 285 02/09/2024   Last metabolic panel Lab Results  Component Value Date   GLUCOSE 92 02/09/2024   NA 138 02/09/2024   K 4.5 02/09/2024   CL 100 02/09/2024   CO2 22 02/09/2024   BUN 14 02/09/2024   CREATININE 1.20 02/09/2024   EGFR 78 02/09/2024   CALCIUM 9.3 02/09/2024   PROT 6.9 02/09/2024   ALBUMIN 4.2 02/09/2024   LABGLOB 2.7 02/09/2024   BILITOT 0.3 02/09/2024   ALKPHOS 44 (L) 02/09/2024   AST 16 02/09/2024    ALT 16 02/09/2024   Last lipids Lab Results  Component Value Date   CHOL 130 11/18/2023   HDL 44 11/18/2023   LDLCALC 68 11/18/2023   TRIG 93 11/18/2023   CHOLHDL 3.0 11/18/2023   Last hemoglobin A1c Lab Results  Component Value Date   HGBA1C 5.1 11/18/2023   Last thyroid  functions Lab Results  Component Value Date   TSH 1.040 11/18/2023   Last vitamin D No results found for: 25OHVITD2, 25OHVITD3, VD25OH Last vitamin B12 and Folate No results found for: VITAMINB12, FOLATE    The 10-year ASCVD risk score (Arnett DK, et al., 2019) is: 0.4%  Assessment & Plan:  Inguinal lymphadenopathy -     Chlamydia/Gonococcus/Trichomonas, NAA -     Doxycycline  Hyclate; Take 1 tablet (100 mg total) by mouth 2 (two) times daily.  Dispense: 14 tablet; Refill: 0 -     Ondansetron ; Take 1 tablet (8 mg total) by mouth every 8 (eight) hours as needed.  Dispense: 20 tablet; Refill: 0  Urethritis -     Chlamydia/Gonococcus/Trichomonas, NAA -     Doxycycline  Hyclate; Take 1 tablet (100 mg total) by mouth 2 (two) times daily.  Dispense: 14 tablet; Refill: 0 -     Ondansetron ; Take 1 tablet (8 mg total) by mouth every 8 (eight) hours as needed.  Dispense: 20 tablet; Refill: 0 -     cefTRIAXone  Sodium  Symptoms consistent with urethritis secondary to STI. All labs and swabs from Monday normal, however possibly due to testing too early. Pt with sx consistent with urethritis therefore will initiate tx. Im rocephin  in office today Will send home with PO doxycycline  Bid. Pt states this makes him nauseated, will also send home with PRN zofran . Must avoid all forms of intercourse until tx completed.  No follow-ups on file.   Benton LITTIE Gave, PA

## 2024-02-15 LAB — CHLAMYDIA/GONOCOCCUS/TRICHOMONAS, NAA
Chlamydia by NAA: NEGATIVE
Gonococcus by NAA: NEGATIVE
Trich vag by NAA: NEGATIVE

## 2024-02-15 LAB — SPECIMEN STATUS REPORT

## 2024-02-16 ENCOUNTER — Ambulatory Visit: Payer: Self-pay | Admitting: Urgent Care

## 2024-02-16 ENCOUNTER — Ambulatory Visit (INDEPENDENT_AMBULATORY_CARE_PROVIDER_SITE_OTHER)

## 2024-02-16 DIAGNOSIS — R590 Localized enlarged lymph nodes: Secondary | ICD-10-CM

## 2024-02-16 DIAGNOSIS — R599 Enlarged lymph nodes, unspecified: Secondary | ICD-10-CM

## 2024-02-16 DIAGNOSIS — R1032 Left lower quadrant pain: Secondary | ICD-10-CM | POA: Diagnosis not present

## 2024-02-16 DIAGNOSIS — R102 Pelvic and perineal pain unspecified side: Secondary | ICD-10-CM

## 2024-02-16 NOTE — Addendum Note (Signed)
 Addended by: Zoua Caporaso on: 02/16/2024 01:25 PM   Modules accepted: Orders

## 2024-02-16 NOTE — Telephone Encounter (Signed)
 Could someone please call the patient today to have him schedule a follow up with me in office? I think a further investigation is warranted, particularly if he is not feeling better with the intervention provided last week. thanks

## 2024-02-16 NOTE — Telephone Encounter (Signed)
 LM for pt to return call to schedule or through MyChart.

## 2024-02-17 ENCOUNTER — Ambulatory Visit: Admitting: Urgent Care

## 2024-02-18 LAB — MYCOPLASMA / UREAPLASMA CULTURE

## 2024-02-25 ENCOUNTER — Ambulatory Visit: Payer: Self-pay

## 2024-02-25 DIAGNOSIS — R0602 Shortness of breath: Secondary | ICD-10-CM | POA: Diagnosis not present

## 2024-02-25 DIAGNOSIS — B029 Zoster without complications: Secondary | ICD-10-CM | POA: Diagnosis not present

## 2024-02-25 DIAGNOSIS — L739 Follicular disorder, unspecified: Secondary | ICD-10-CM | POA: Diagnosis not present

## 2024-02-25 DIAGNOSIS — R079 Chest pain, unspecified: Secondary | ICD-10-CM | POA: Diagnosis not present

## 2024-02-25 NOTE — Telephone Encounter (Signed)
 Patient reports lower back pain for the last four days along with burning and itching to his back. Patient states he is concerned for shingles as the symptoms were burning and itching to his back with previous shingles Dx. Patient states pain is 8 to 9 out of 10. Initially discussed with patient being seen today-patient would have to be seen at [facility] or ED. No appointments available today in the clinics. Patient states he is unable to use [facility] due to insurance. Patient asking if he could get an appointment for tomorrow. Patient scheduled for tomorrow at 10:50 AM at alternative location.  FYI Only or Action Required?: FYI only for provider.  Patient was last seen in primary care on 02/12/2024 by Lowella Benton CROME, PA.  Called Nurse Triage reporting Back Pain.  Symptoms began several days ago.  Interventions attempted: Rest, hydration, or home remedies.  Symptoms are: unchanged.  Triage Disposition: See Physician Within 24 Hours (overriding See HCP Within 4 Hours (Or PCP Triage))  Patient/caregiver understands and will follow disposition?: Yes  Copied from CRM [id number]. Topic: Clinical - Red Word Triage >> Feb 25, 2024  9:24 AM Charlet HERO wrote: Red Word that prompted transfer to Nurse Triage: Patient thinks he has shingles he has burning and itching on his back and stomach has sores that have been on his body for 2 weeks they are red and itchy. Lower back pain feels like he is being stabed. [place] Latino Reason for Disposition  [1] SEVERE back pain (e.g., excruciating, unable to do any normal activities) AND [2] not improved 2 hours after pain medicine  Answer Assessment - Initial Assessment Questions 1. ONSET: When did the pain begin? (e.g., minutes, hours, days)     4 days ago 2. LOCATION: Where does it hurt? (upper, mid or lower back)     Left lower back 3. SEVERITY: How bad is the pain?  (e.g., Scale 1-10; mild, moderate, or severe)     8-9 out of 10 4. PATTERN: Is the pain  constant? (e.g., yes, no; constant, intermittent)      constant 5. RADIATION: Does the pain shoot into your legs or somewhere else?     Radiates to abdomen 6. CAUSE:  What do you think is causing the back pain?      unsure 7. BACK OVERUSE:  Any recent lifting of heavy objects, strenuous work or exercise?     no 8. MEDICINES: What have you taken so far for the pain? (e.g., nothing, acetaminophen , NSAIDS)     tylenol  9. NEUROLOGIC SYMPTOMS: Do you have any weakness, numbness, or problems with bowel/bladder control?     weakness 10. OTHER SYMPTOMS: Do you have any other symptoms? (e.g., fever, abdomen pain, burning with urination, blood in urine)       Burning and itching on back  Protocols used: Back Pain-A-AH

## 2024-02-26 ENCOUNTER — Ambulatory Visit: Admitting: Family Medicine

## 2024-03-08 ENCOUNTER — Ambulatory Visit: Admitting: Family Medicine

## 2024-04-13 ENCOUNTER — Inpatient Hospital Stay: Admitting: Family Medicine

## 2024-06-02 ENCOUNTER — Ambulatory Visit: Payer: Self-pay

## 2024-06-02 NOTE — Telephone Encounter (Signed)
 Per E2C2 Triage RN -   The patient is scheduled for 06/04/24 at 1110 with [facility]. Patient went to [facility] for abscess at the back of throat on Saturday 05/29/24 and was prescribed a 10 day course of antibiotics. Patient states that he has been taking the antibiotic since Saturday and his symptoms are the same. He also reports more frequent panic attacks recently and would like to discuss with new PCP.

## 2024-06-02 NOTE — Telephone Encounter (Signed)
 FYI Only or Action Required?: FYI only for provider: appointment scheduled on 06/04/24.  Patient was last seen in primary care on 02/12/2024 by Lowella Benton CROME, PA.  Called Nurse Triage reporting Abscess.  Symptoms began a week ago.  Interventions attempted: Prescription medications: amoxicillin -pot clavulanate (AUGMENTIN ) 875-125 mg, Prednisone .  Symptoms are: unchanged.  Triage Disposition: See PCP When Office is Open (Within 3 Days)  Patient/caregiver understands and will follow disposition?: Yes  Reason for Disposition  [1] Treatment (antibiotic, I&D, or moist heat) > 72 hours (3 days) AND [2] symptoms are SAME (not getting better)  Answer Assessment - Initial Assessment Questions [facility] at the back of throat on Saturday 05/29/24 and was prescribed a 10 day course of antibiotics. Patient states that he has been taking the antibiotic since Saturday and his symptoms are the same. He also reports more frequent panic attacks recently and would like to discuss with PCP. Office visit advised. Advised patient that he will also need to establish care with new PCP as current PCP is no longer with [facility].   1. APPEARANCE: What does the boil (abscess) look like?      States that it resembles strep throat  2. LOCATION: Where is the boil located?      Back of throat  3. NUMBER: How many boils are there?      1  4. SIZE: How big is the boil? (e.g., inches, cm; compare to size of a coin or other object)     Unknown  5. ONSET: When did the boil start?     About a week ago  6. PAIN: Is there any pain? If Yes, ask: How bad is the pain?  (Scale 1-10; or mild, moderate, severe)     Yes, 5/10  7. FEVER: Do you have a fever? If Yes, ask: What is it, how was it measured, and when did it start?      Had fever over the weekend, but not currently  8. TREATMENT: What treatment did you get or are you getting for the boil? (e.g., I&D, antibiotics,  moist heat)     amoxicillin -pot clavulanate (AUGMENTIN ) 875-125 mg, Prednisone   9. OTHER SYMPTOMS: Do you have any other symptoms? (e.g., rash elsewhere on body, shaking chills, spreading redness of nearby skin or red streaks, weakness)      Denies any other symptoms  10. PREGNANCY: Is there any chance you are pregnant? When was your last menstrual period?       NA  Protocols used: Boil (Skin Abscess) on Treatment Follow-up Call-A-AH  Reason for Triage: Patient was seen for an abscess and was given antibiotics but pain has not improved and has worsened. Patient also mentioned that he has been having panic attacks recently feeling like his heart is going to explode.

## 2024-06-04 ENCOUNTER — Ambulatory Visit: Admitting: Medical-Surgical

## 2024-06-04 VITALS — BP 129/71 | HR 75 | Resp 20 | Ht 67.0 in | Wt 182.0 lb

## 2024-06-04 DIAGNOSIS — F410 Panic disorder [episodic paroxysmal anxiety] without agoraphobia: Secondary | ICD-10-CM

## 2024-06-04 DIAGNOSIS — R0982 Postnasal drip: Secondary | ICD-10-CM

## 2024-06-04 DIAGNOSIS — F1521 Other stimulant dependence, in remission: Secondary | ICD-10-CM | POA: Diagnosis not present

## 2024-06-04 DIAGNOSIS — F4323 Adjustment disorder with mixed anxiety and depressed mood: Secondary | ICD-10-CM

## 2024-06-04 DIAGNOSIS — Z7689 Persons encountering health services in other specified circumstances: Secondary | ICD-10-CM | POA: Diagnosis not present

## 2024-06-04 MED ORDER — AZELASTINE HCL 0.1 % NA SOLN: 2.0000 | Freq: Two times a day (BID) | NASAL | 11 refills | Status: AC

## 2024-06-04 MED ORDER — CETIRIZINE HCL 10 MG PO TABS: 10.0000 mg | ORAL_TABLET | Freq: Every day | ORAL | 11 refills | Status: AC

## 2024-06-04 MED ORDER — HYDROXYZINE PAMOATE 25 MG PO CAPS: 25.0000 mg | ORAL_CAPSULE | Freq: Three times a day (TID) | ORAL | 1 refills | Status: AC | PRN

## 2024-06-05 ENCOUNTER — Encounter: Payer: Self-pay | Admitting: Medical-Surgical

## 2024-06-05 DIAGNOSIS — F410 Panic disorder [episodic paroxysmal anxiety] without agoraphobia: Secondary | ICD-10-CM

## 2024-06-07 NOTE — Telephone Encounter (Signed)
 LM to return call.

## 2024-06-10 ENCOUNTER — Ambulatory Visit: Admitting: Medical-Surgical

## 2024-06-11 ENCOUNTER — Ambulatory Visit: Admitting: Medical-Surgical

## 2024-06-14 ENCOUNTER — Ambulatory Visit: Admitting: Medical-Surgical
